# Patient Record
Sex: Male | Born: 1965 | Race: White | Hispanic: No | Smoking: Former smoker
Health system: Southern US, Community
[De-identification: ages and names within clinical notes are randomized; demographics above are authoritative.]

## PROBLEM LIST (undated history)

## (undated) DIAGNOSIS — K5790 Diverticulosis of intestine, part unspecified, without perforation or abscess without bleeding: Secondary | ICD-10-CM

## (undated) DIAGNOSIS — I10 Essential (primary) hypertension: Secondary | ICD-10-CM

## (undated) DIAGNOSIS — K589 Irritable bowel syndrome without diarrhea: Secondary | ICD-10-CM

## (undated) DIAGNOSIS — G4730 Sleep apnea, unspecified: Secondary | ICD-10-CM

## (undated) DIAGNOSIS — K219 Gastro-esophageal reflux disease without esophagitis: Secondary | ICD-10-CM

## (undated) HISTORY — PX: FOOT SURGERY: SHX648

## (undated) HISTORY — DX: Essential (primary) hypertension: I10

## (undated) HISTORY — DX: Sleep apnea, unspecified: G47.30

## (undated) HISTORY — DX: Irritable bowel syndrome, unspecified: K58.9

## (undated) HISTORY — DX: Diverticulosis of intestine, part unspecified, without perforation or abscess without bleeding: K57.90

## (undated) HISTORY — DX: Gastro-esophageal reflux disease without esophagitis: K21.9

## (undated) HISTORY — PX: COLONOSCOPY: SHX174

---

## 2003-06-17 ENCOUNTER — Other Ambulatory Visit: Payer: Self-pay

## 2004-05-05 ENCOUNTER — Emergency Department: Payer: Self-pay | Admitting: Emergency Medicine

## 2004-10-21 ENCOUNTER — Ambulatory Visit: Payer: Self-pay

## 2004-12-06 ENCOUNTER — Emergency Department: Payer: Self-pay | Admitting: Emergency Medicine

## 2006-11-01 ENCOUNTER — Ambulatory Visit: Payer: Self-pay

## 2006-11-15 ENCOUNTER — Ambulatory Visit: Payer: Self-pay

## 2007-04-16 ENCOUNTER — Ambulatory Visit: Payer: Self-pay

## 2010-08-14 ENCOUNTER — Ambulatory Visit: Payer: Self-pay | Admitting: Internal Medicine

## 2011-05-23 ENCOUNTER — Ambulatory Visit: Payer: Self-pay | Admitting: General Practice

## 2013-04-22 ENCOUNTER — Emergency Department: Payer: Self-pay | Admitting: Emergency Medicine

## 2015-06-04 ENCOUNTER — Ambulatory Visit (INDEPENDENT_AMBULATORY_CARE_PROVIDER_SITE_OTHER): Payer: PRIVATE HEALTH INSURANCE | Admitting: Podiatry

## 2015-06-04 ENCOUNTER — Ambulatory Visit (INDEPENDENT_AMBULATORY_CARE_PROVIDER_SITE_OTHER): Payer: PRIVATE HEALTH INSURANCE

## 2015-06-04 ENCOUNTER — Telehealth: Payer: Self-pay | Admitting: *Deleted

## 2015-06-04 ENCOUNTER — Encounter: Payer: Self-pay | Admitting: Podiatry

## 2015-06-04 VITALS — BP 136/88 | HR 69 | Resp 16

## 2015-06-04 DIAGNOSIS — M779 Enthesopathy, unspecified: Secondary | ICD-10-CM

## 2015-06-04 DIAGNOSIS — S92352A Displaced fracture of fifth metatarsal bone, left foot, initial encounter for closed fracture: Secondary | ICD-10-CM

## 2015-06-04 DIAGNOSIS — M79672 Pain in left foot: Secondary | ICD-10-CM

## 2015-06-04 MED ORDER — METHYLPREDNISOLONE 4 MG PO TBPK: ORAL_TABLET | ORAL | Status: DC

## 2015-06-04 MED ORDER — MELOXICAM 15 MG PO TABS: 15.0000 mg | ORAL_TABLET | Freq: Every day | ORAL | Status: DC

## 2015-06-04 NOTE — Progress Notes (Signed)
   Subjective:    Patient ID: Alexander Ramsey, male    DOB: November 01, 1965, 49 y.o.   MRN: [id number]  HPI: He presents today with a several month duration of pain to his fifth metatarsal base and lateral ankle left. States that many years ago as a teenager he injured his foot while playing football and this area would occasionally become painful however it would generally subside he states that as of late it has become very painful to the point where he can hardly walk.  Review of Systems  HENT: Positive for sinus pressure.   Musculoskeletal: Positive for back pain, arthralgias and gait problem.  Neurological: Positive for numbness.  All other systems reviewed and are negative.      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 Pulses are strongly palpable. Neurologic sensorium is intact per Semmes-Weinstein monofilament. Deep tendon reflexes are intact. Muscle strength +5 over 5 dorsiflexion plantar flexors and inverters and everters all into the musculature is intact. Orthopedic evaluation demonstrates all joints distal to the ankle while full range of motion without crepitation. His pain on eversion against resistance as we palpate the fifth metatarsal base and the peroneal brevis tendon left. Radiographic evaluation 3 views left foot taken in the office today has demonstrated old fracture to the fifth metatarsal base with an avulsion fracture and thickening of the peroneal tendon. Cutaneous evaluation demonstrates no open lesions or wounds.        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  Plan: At this point this is a surgical fix. I did recommend a ankle stabilizer and an MRI. I also recommended prednisone as well as meloxicam. I will follow up with him for surgical consult after the MRI.

## 2015-06-04 NOTE — Telephone Encounter (Addendum)
Pt states Dr. Milinda Pointer had discuss possible surgery, but had wanted a MRI 1st, and pt wanted to schedule for 06/2015.  I told pt once I had received the orders from Dr. Milinda Pointer, I would get the procedure pre-certed but I would not be the one to schedule, it would be [facility] to call and schedule.  Orders faxed to [facility].  [place], NO PRIOR AUTHORIZATION NEEDED IF PROCEDURE IS IN-NETWORK, [facility] OF:[id number], REFERENCE:  Robina Ade., 06/05/2015 2:29PM.  FAXED TO [place].  Informed pt's wife, Freida Busman of the insurance prior authorization outcome and gave her central appt line [facility].

## 2015-06-04 NOTE — Addendum Note (Signed)
Addended by: Clovis Riley E on: 06/04/2015 05:50 PM   Modules accepted: Orders

## 2015-06-05 ENCOUNTER — Telehealth: Payer: Self-pay | Admitting: *Deleted

## 2015-06-05 DIAGNOSIS — M779 Enthesopathy, unspecified: Secondary | ICD-10-CM

## 2015-06-05 DIAGNOSIS — S92352A Displaced fracture of fifth metatarsal bone, left foot, initial encounter for closed fracture: Secondary | ICD-10-CM

## 2015-06-05 NOTE — Telephone Encounter (Signed)
"  I'm calling for my husband, Alexander Ramsey.  He was seen today by Dr. Milinda Pointer.  It was determined that he needed surgery.  I'm calling, I'm not sure what is left.  I talked with Jocelyn Lamer and we do want to get it done before the end of this year.  If you could go ahead and get it scheduled and call me with the information.  Also Marcy Siren is trying to schedule an MRI for him.  Can you find out where we are with this?  I'd appreciate it.  Give me a call back."

## 2015-06-05 NOTE — Telephone Encounter (Signed)
I informed pt's wife, that as I had informed pt yesterday once the MRI was approved by his insurance the [facility] would call to schedule.  I told her that I had a period of time that I would worked prior authorizations and I would begin his today.

## 2015-06-05 NOTE — Telephone Encounter (Addendum)
Pt's wife, Freida Busman states the earliest MRI could be scheduled at [facility] was 06/24/2015, and Dr. Stephenie Acres last DOS 06/27/2015, and pt's insurance deductible is going up at the 1st of the year.  I called [facility] and the 1st available was 06/11/2015, called [facility] [phone], NOT PRIOR AUTHORIZATION FOR FACILITY Deeann Saint TAX ID# [id number], REFERENCE:  Gwynneth Aliment. 06/05/2015 3:39pm.  Orders and insurance outcome faxed to [facility] informed to call and schedule.  06/10/2015 [facility] request changing MRI foot to MRI ankle.  I asked Dr. Milinda Pointer and he states that as long as the 5th metatarsal base tendon area and non-union are visible that is okay.  06/16/2015 - Informed pt's wife of need for an appt, she states the appt is set for 06/20/2015.

## 2015-06-06 ENCOUNTER — Telehealth: Payer: Self-pay | Admitting: *Deleted

## 2015-06-06 NOTE — Telephone Encounter (Signed)
"  I'm returning your call from yesterday.  "Yes, I know you don't know the procedure that Alexander Ramsey is going to need.  We are trying to get our ducks in a row.  We scheduled his MRI for the 7th and he's scheduled to see Dr. Milinda Pointer on the 14th for the results.  Dr. Milinda Pointer had mentioned that his only other available date for surgery is the 23rd.  Is there any way possible we can get put him down for that day?"  I can put him down tentatively.  "Oh that would be great!  The reason we need to do it now is because next year our deductible will be $1500."  I understand, I'll put him down tentatively.

## 2015-06-16 ENCOUNTER — Other Ambulatory Visit: Payer: Self-pay | Admitting: Family Medicine

## 2015-06-18 ENCOUNTER — Ambulatory Visit: Payer: PRIVATE HEALTH INSURANCE | Admitting: Podiatry

## 2015-06-18 ENCOUNTER — Telehealth: Payer: Self-pay | Admitting: *Deleted

## 2015-06-18 NOTE — Telephone Encounter (Addendum)
Pt's wife, Freida Busman states pt works for the [employer] and got caught up in something at work and was going to be 15 minutes late and the office rescheduled him to 06/25/2015, would that be too late for a 06/27/2015 surgery.  I told her no at times we have had to book emergency the day before.  I told her I would inform the surgery coordinator so she would be prepared. 06/25/2015 - Requested MRI results to be faxed to [facility].

## 2015-06-24 ENCOUNTER — Ambulatory Visit: Payer: Self-pay

## 2015-06-25 ENCOUNTER — Encounter: Payer: Self-pay | Admitting: Podiatry

## 2015-06-25 ENCOUNTER — Ambulatory Visit (INDEPENDENT_AMBULATORY_CARE_PROVIDER_SITE_OTHER): Payer: PRIVATE HEALTH INSURANCE | Admitting: Podiatry

## 2015-06-25 VITALS — BP 149/89 | HR 79 | Resp 18

## 2015-06-25 DIAGNOSIS — S86312D Strain of muscle(s) and tendon(s) of peroneal muscle group at lower leg level, left leg, subsequent encounter: Secondary | ICD-10-CM | POA: Diagnosis not present

## 2015-06-25 DIAGNOSIS — S92352A Displaced fracture of fifth metatarsal bone, left foot, initial encounter for closed fracture: Secondary | ICD-10-CM

## 2015-06-25 DIAGNOSIS — M779 Enthesopathy, unspecified: Secondary | ICD-10-CM | POA: Diagnosis not present

## 2015-06-25 NOTE — Progress Notes (Signed)
He presents today for MRI results of his left foot and ankle. He states that the anti-inflammatories that we provided for him the prednisone and secondly the meloxicam seem to work wonders for him. He states that he is doing much better with this at this time. He knows that these will have Surgery regarding his left foot and like to go ahead and schedule it if possible.  Objective: Vital signs are stable he is alert and oriented 3 pulses are strongly palpable to the left lower extremity. Moderate edema overlying the peroneal tendons of the left foot with pain on palpation of the peroneal tendons as well as the fifth metatarsal base of the left foot. I reviewed the MRI with him today which did indicate a tear of the peroneal brevis tendon as well as a fifth metatarsal base fracture.  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  Plan: We went over a consent form today line by line number by number giving him ample time to ask questions he saw fit regarding a primary repair of the peroneal brevis tendon of the left foot. I answered all questions regarding this procedure to the best of my ability in layman's terms. He understands this and is amenable to it. He understands that there are possible postop complications which may include but are not limited to postop pain bleeding swelling infection recurrence and need for further surgery loss of digit loss of limb loss of life. We also discussed ambulation and that he will not be ambulating on this for a period of 6-8 weeks and will need the 2 utilized a knee scooter or crutches. I will follow-up with him Friday x-rays were especially surgical center.

## 2015-06-25 NOTE — Patient Instructions (Signed)
Pre-Operative Instructions  Congratulations, you have decided to take an important step to improving your quality of life.  You can be assured that the doctors of [facility] will be with you every step of the way.  1. Plan to be at the surgery center/hospital at least 1 (one) hour prior to your scheduled time unless otherwise directed by the surgical center/hospital staff.  You must have a responsible adult accompany you, remain during the surgery and drive you home.  Make sure you have directions to the surgical center/hospital and know how to get there on time. 2. For hospital based surgery you will need to obtain a history and physical form from your family physician within 1 month prior to the date of surgery- we will give you a form for you primary physician.  3. We make every effort to accommodate the date you request for surgery.  There are however, times where surgery dates or times have to be moved.  We will contact you as soon as possible if a change in schedule is required.   4. No Aspirin/Ibuprofen for one week before surgery.  If you are on aspirin, any non-steroidal anti-inflammatory medications (Mobic, Aleve, Ibuprofen) you should stop taking it 7 days prior to your surgery.  You make take Tylenol  For pain prior to surgery.  5. Medications- If you are taking daily heart and blood pressure medications, seizure, reflux, allergy, asthma, anxiety, pain or diabetes medications, make sure the surgery center/hospital is aware before the day of surgery so they may notify you which medications to take or avoid the day of surgery. 6. No food or drink after midnight the night before surgery unless directed otherwise by surgical center/hospital staff. 7. No alcoholic beverages 24 hours prior to surgery.  No smoking 24 hours prior to or 24 hours after surgery. 8. Wear loose pants or shorts- loose enough to fit over bandages, boots, and casts. 9. No slip on shoes, sneakers are best. 10. Bring  your boot with you to the surgery center/hospital.  Also bring crutches or a walker if your physician has prescribed it for you.  If you do not have this equipment, it will be provided for you after surgery. 11. If you have not been contracted by the surgery center/hospital by the day before your surgery, call to confirm the date and time of your surgery. 12. Leave-time from work may vary depending on the type of surgery you have.  Appropriate arrangements should be made prior to surgery with your employer. 13. Prescriptions will be provided immediately following surgery by your doctor.  Have these filled as soon as possible after surgery and take the medication as directed. 14. Remove nail polish on the operative foot. 15. Wash the night before surgery.  The night before surgery wash the foot and leg well with the antibacterial soap provided and water paying special attention to beneath the toenails and in between the toes.  Rinse thoroughly with water and dry well with a towel.  Perform this wash unless told not to do so by your physician.  Enclosed: 1 Ice pack (please put in freezer the night before surgery)   1 Hibiclens skin cleaner   Pre-op Instructions  If you have any questions regarding the instructions, do not hesitate to call our office.  [place]: [address] [place], [place] [address] [phone]  [place]: [address] [place], [place] [address] [phone]  [place]: [address]  , [place] [address] [phone]  Dr. Richard   Tuchman DPM, Dr. Norman Regal DPM Dr. Richard Sikora DPM, Dr. M. Todd Hyatt DPM, Dr. Kathryn Egerton DPM 

## 2015-06-26 ENCOUNTER — Other Ambulatory Visit: Payer: Self-pay | Admitting: Podiatry

## 2015-06-26 ENCOUNTER — Telehealth: Payer: Self-pay | Admitting: *Deleted

## 2015-06-26 MED ORDER — HYDROMORPHONE HCL 4 MG PO TABS: ORAL_TABLET | ORAL | Status: DC

## 2015-06-26 MED ORDER — CEPHALEXIN 500 MG PO CAPS: 500.0000 mg | ORAL_CAPSULE | Freq: Three times a day (TID) | ORAL | Status: DC

## 2015-06-26 MED ORDER — PROMETHAZINE HCL 25 MG PO TABS: 25.0000 mg | ORAL_TABLET | Freq: Three times a day (TID) | ORAL | Status: DC | PRN

## 2015-06-26 NOTE — Telephone Encounter (Signed)
"  Please give me a call.  I need to get the procedure code.  That page of the fax was cut off."    I attempted to call.  I could not leave a message phone continuously rung.    I faxed requested information to her.

## 2015-06-26 NOTE — Telephone Encounter (Signed)
I'm calling to inform you there is an issue with your insurance.  There's a clause that states that your surgeries have to be done in [facility].  I am trying to get it authorized.  They requested your clinical notes.  I faxed them the information.  I attempted to call and check on the status but they were closed.  The surgical center is going to call in the morning to check on the status.  "Okay, just have them to call me and let me know.  I should be home all day."

## 2015-06-26 NOTE — Telephone Encounter (Signed)
I called and left a message for Caren Griffins at [facility] to call Aniceto Boss at [place] directly to check on the status of authorization.  Her direct number is [phone].

## 2015-06-27 DIAGNOSIS — S92352A Displaced fracture of fifth metatarsal bone, left foot, initial encounter for closed fracture: Secondary | ICD-10-CM | POA: Diagnosis not present

## 2015-07-02 ENCOUNTER — Ambulatory Visit (INDEPENDENT_AMBULATORY_CARE_PROVIDER_SITE_OTHER): Payer: PRIVATE HEALTH INSURANCE | Admitting: Podiatry

## 2015-07-02 ENCOUNTER — Encounter: Payer: Self-pay | Admitting: Podiatry

## 2015-07-02 DIAGNOSIS — S86312D Strain of muscle(s) and tendon(s) of peroneal muscle group at lower leg level, left leg, subsequent encounter: Secondary | ICD-10-CM

## 2015-07-02 NOTE — Progress Notes (Signed)
He presents today utilizing a mini scooter status post peroneal tendon repair and removal of a fracture fragment fifth metatarsal base left foot. He states that I have really had no pain with this whatsoever. He denies fever chills nausea vomiting muscle aches and pains.  Objective: Vital signs are stable he is alert and oriented 3. He presents today with a knee scooter and his cast left lower extremity. The bottom of the the cast is dirty and he relates to being on the cast more than he should've been. He states that his toes have good sensation and they're freely movable. He has no calf pain or shortness of breath.  Assessment: No pain status post peroneal tendon repair left.  Plan: Stay in his cast for another week continue nonweightbearing status. We will remove the cast next week and remove some sutures as possible. We will then reapply another cast.

## 2015-07-09 ENCOUNTER — Ambulatory Visit (INDEPENDENT_AMBULATORY_CARE_PROVIDER_SITE_OTHER): Payer: PRIVATE HEALTH INSURANCE | Admitting: Podiatry

## 2015-07-09 ENCOUNTER — Encounter: Payer: Self-pay | Admitting: Podiatry

## 2015-07-09 DIAGNOSIS — Z9889 Other specified postprocedural states: Secondary | ICD-10-CM | POA: Diagnosis not present

## 2015-07-09 DIAGNOSIS — S86312D Strain of muscle(s) and tendon(s) of peroneal muscle group at lower leg level, left leg, subsequent encounter: Secondary | ICD-10-CM | POA: Diagnosis not present

## 2015-07-09 MED ORDER — DOXYCYCLINE HYCLATE 100 MG PO TABS: 100.0000 mg | ORAL_TABLET | Freq: Two times a day (BID) | ORAL | Status: DC

## 2015-07-09 NOTE — Progress Notes (Signed)
He presents today 2 weeks status post peroneal tendon repair left foot. He denies fever chills nausea vomiting muscle aches and pains. He states that he walks on the cast occasionally.  Objective: Once his old cast was removed there demonstrates some erythema along the lateral incision site but no cellulitis drainage or odor. This appears to be maceration. No tenderness on palpation.  Assessment: Well-healing surgical foot left.  Plan: Follow up with him in 2 weeks for cast removal as we replaced his cast today. I also dispensed doxycycline just to help cover any slight skin infection. He is to keep this elevated and dry we will allow him to return to light duty working 4-6 hours a day next week.

## 2015-07-14 ENCOUNTER — Encounter: Payer: Self-pay | Admitting: Podiatry

## 2015-07-16 ENCOUNTER — Encounter: Payer: Self-pay | Admitting: Podiatry

## 2015-07-21 ENCOUNTER — Other Ambulatory Visit: Payer: Self-pay | Admitting: Family Medicine

## 2015-07-23 ENCOUNTER — Ambulatory Visit (INDEPENDENT_AMBULATORY_CARE_PROVIDER_SITE_OTHER): Payer: Managed Care, Other (non HMO) | Admitting: Podiatry

## 2015-07-23 ENCOUNTER — Encounter: Payer: Self-pay | Admitting: Podiatry

## 2015-07-23 DIAGNOSIS — S86312D Strain of muscle(s) and tendon(s) of peroneal muscle group at lower leg level, left leg, subsequent encounter: Secondary | ICD-10-CM

## 2015-07-23 DIAGNOSIS — Z9889 Other specified postprocedural states: Secondary | ICD-10-CM | POA: Diagnosis not present

## 2015-07-23 NOTE — Progress Notes (Signed)
He presents today just short of 4 weeks status post peroneal tendon repair left foot. He presents today in his cast and his knee scooter. He does state that he had a very good weekend at the beach and had an accident with his knee scooter. He states he really hasn't had a lot of pain with this and feels much better.  Objective: Vital signs are stable he is alert and oriented 3 presents with his cast on today was removed demonstrates a considerable amount of dead skin staples are intact once removed demonstrates margins well coapted no erythema minimal edema no cellulitis drainage or odor. He has full range of motion without pain plantar flexion abduction against resistance dorsiflexion and inversion.  Assessment: Well-healing surgical foot left.  Plan: We removed his cast today removed all of his sutures and staples and placed him in a compression anklet and a Cam Walker. I encouraged him to continue the use of the scooter for the next 2 weeks in a nonweightbearing fashion possibly putting his foot down in a static motion after next week. I will allow him to start washing this and getting this foot cleaned up and I will follow-up with him in 2 weeks.

## 2015-08-05 ENCOUNTER — Other Ambulatory Visit: Payer: Self-pay | Admitting: Family Medicine

## 2015-08-11 ENCOUNTER — Encounter: Payer: Self-pay | Admitting: Podiatry

## 2015-08-11 ENCOUNTER — Ambulatory Visit (INDEPENDENT_AMBULATORY_CARE_PROVIDER_SITE_OTHER): Payer: Managed Care, Other (non HMO) | Admitting: Podiatry

## 2015-08-11 VITALS — BP 120/86 | HR 69 | Resp 12

## 2015-08-11 DIAGNOSIS — S86312D Strain of muscle(s) and tendon(s) of peroneal muscle group at lower leg level, left leg, subsequent encounter: Secondary | ICD-10-CM

## 2015-08-11 DIAGNOSIS — Z9889 Other specified postprocedural states: Secondary | ICD-10-CM

## 2015-08-11 NOTE — Progress Notes (Signed)
He presents today for follow-up of peroneal brevis surgery left foot. Date of surgery 06/27/2015. He denies fever chills nausea vomiting muscle aches and pains. States that his foot does not hurt him at all.  Objective: Vital signs are stable he is alert and oriented 3. Pulses are strongly palpable. He has no pain with abduction against resistance and no pain on palpation of the peroneal brevis tendon or incision site. No edema no cellulitis drainage or odor.  Assessment: Well-healing surgical foot left.  Plan: I encouraged him to wear supportive shoe gear and wean off of his Cam Walker over the next month I will follow up with him at that time. He will notify me with any questions or concerns.

## 2015-09-08 ENCOUNTER — Ambulatory Visit (INDEPENDENT_AMBULATORY_CARE_PROVIDER_SITE_OTHER): Payer: Managed Care, Other (non HMO) | Admitting: Podiatry

## 2015-09-08 ENCOUNTER — Encounter: Payer: Self-pay | Admitting: Podiatry

## 2015-09-08 VITALS — BP 127/79 | HR 87 | Resp 12

## 2015-09-08 DIAGNOSIS — Z9889 Other specified postprocedural states: Secondary | ICD-10-CM

## 2015-09-08 NOTE — Progress Notes (Signed)
He presents today for follow-up of his peroneal brevis tendon repair left. He states that he still gets sore on occasion particularly when I'm on the bike but all in all it is doing so much better than it was prior to surgery. Date of surgery 06/27/2015.  Objective: Vital signs stable alert and oriented 3. Pulses are palpable. Neurologic sensorium is intact per Semmes-Weinstein monofilament. Deep tendon reflexes are intact muscle strength +5 over 5 dorsiflexion plantar flexors and inverters everters all intrinsic musculature is intact. No pain on palpation or abduction against resistance of the left foot along the peroneal brevis tendon.  Assessment well-healing surgical foot left.  Plan: After 6 months he may get back to his regular routine until then he is to continue his current workout regimen and I will follow-up with him on an as-needed basis.

## 2015-09-15 ENCOUNTER — Ambulatory Visit (INDEPENDENT_AMBULATORY_CARE_PROVIDER_SITE_OTHER): Payer: Managed Care, Other (non HMO) | Admitting: Podiatry

## 2015-09-15 ENCOUNTER — Encounter: Payer: Self-pay | Admitting: Podiatry

## 2015-09-15 VITALS — BP 164/101 | HR 97 | Resp 16

## 2015-09-15 DIAGNOSIS — Z9889 Other specified postprocedural states: Secondary | ICD-10-CM

## 2015-09-15 DIAGNOSIS — M722 Plantar fascial fibromatosis: Secondary | ICD-10-CM | POA: Diagnosis not present

## 2015-09-15 DIAGNOSIS — S86312D Strain of muscle(s) and tendon(s) of peroneal muscle group at lower leg level, left leg, subsequent encounter: Secondary | ICD-10-CM

## 2015-09-15 NOTE — Progress Notes (Signed)
He presents today 1 week after being released from peroneal tendon repair. He states that he may have overdone it on the treadmill. He states that his heel started hurting Friday and was really bad on Saturday.  Objective: Vital signs are stable he is alert and oriented 3. Pulses are palpable. He has pain on palpation medial calcaneal tubercle and central plantar calcaneal tubercle of the left foot. No pain on palpation of the surgical site.  Assessment: Plantar fasciitis left foot.  Plan: Injected his left heel and place him in ankle brace. We will follow-up with him as needed.

## 2015-09-16 ENCOUNTER — Other Ambulatory Visit: Payer: Self-pay | Admitting: Family Medicine

## 2015-09-16 ENCOUNTER — Encounter: Payer: Self-pay | Admitting: Family Medicine

## 2015-09-16 NOTE — Telephone Encounter (Signed)
Letter sent.

## 2015-09-16 NOTE — Telephone Encounter (Signed)
apt 

## 2015-09-17 ENCOUNTER — Other Ambulatory Visit: Payer: Self-pay | Admitting: Family Medicine

## 2015-10-06 ENCOUNTER — Ambulatory Visit: Payer: Managed Care, Other (non HMO) | Admitting: Podiatry

## 2015-11-10 ENCOUNTER — Encounter: Payer: Self-pay | Admitting: Family Medicine

## 2015-11-10 ENCOUNTER — Other Ambulatory Visit: Payer: Self-pay | Admitting: Family Medicine

## 2015-11-10 NOTE — Telephone Encounter (Signed)
Apt with anyone

## 2015-11-10 NOTE — Telephone Encounter (Signed)
2nd letter sent

## 2015-11-25 ENCOUNTER — Encounter: Payer: Self-pay | Admitting: Family Medicine

## 2015-11-25 ENCOUNTER — Ambulatory Visit (INDEPENDENT_AMBULATORY_CARE_PROVIDER_SITE_OTHER): Payer: Managed Care, Other (non HMO) | Admitting: Family Medicine

## 2015-11-25 VITALS — BP 133/88 | HR 85 | Temp 98.7°F | Wt 300.0 lb

## 2015-11-25 DIAGNOSIS — I10 Essential (primary) hypertension: Secondary | ICD-10-CM | POA: Diagnosis not present

## 2015-11-25 DIAGNOSIS — F329 Major depressive disorder, single episode, unspecified: Secondary | ICD-10-CM | POA: Diagnosis not present

## 2015-11-25 DIAGNOSIS — F32A Depression, unspecified: Secondary | ICD-10-CM

## 2015-11-25 MED ORDER — ESOMEPRAZOLE MAGNESIUM 40 MG PO CPDR: 40.0000 mg | DELAYED_RELEASE_CAPSULE | Freq: Every day | ORAL | Status: DC

## 2015-11-25 MED ORDER — LOSARTAN POTASSIUM-HCTZ 50-12.5 MG PO TABS: 1.0000 | ORAL_TABLET | Freq: Every day | ORAL | Status: DC

## 2015-11-25 MED ORDER — METOPROLOL SUCCINATE ER 50 MG PO TB24: 50.0000 mg | ORAL_TABLET | Freq: Every day | ORAL | Status: DC

## 2015-11-25 MED ORDER — CITALOPRAM HYDROBROMIDE 40 MG PO TABS: 40.0000 mg | ORAL_TABLET | Freq: Every day | ORAL | Status: DC

## 2015-11-25 NOTE — Progress Notes (Signed)
   BP 133/88 mmHg  Pulse 85  Temp(Src) 98.7 F (37.1 C)  Wt 300 lb (136.079 kg)  SpO2 99%   Subjective:    Patient ID: Alexander Ramsey, male    DOB: 1965/12/20, 51 y.o.   MRN: [id number]  HPI: Alexander Ramsey is a 50 y.o. male  Chief Complaint  Patient presents with  . Depression  . Hypertension  . pain in genital area   Patient with great deal of stress with best friend dying from colon cancer surgery. Depressions okay taking citalopram without problems and will continue. Blood pressure all in all is been okay but elevated these last 2 weeks after death of his friend as above. Taking medications without side effects. Blood pressures been controlled with diet exercise nutrition which patient is starting to go back his already lost 10 pounds. Has some nonspecific tingling-type burning pain in testicle area between testes last a few seconds has abated today no blood in stool or urine or other bowel-type issues of sexual dysfunction  Relevant past medical, surgical, family and social history reviewed and updated as indicated. Interim medical history since our last visit reviewed. Allergies and medications reviewed and updated.  Review of Systems  Constitutional: Negative.   Respiratory: Negative.   Cardiovascular: Negative.     Per HPI unless specifically indicated above     Objective:    BP 133/88 mmHg  Pulse 85  Temp(Src) 98.7 F (37.1 C)  Wt 300 lb (136.079 kg)  SpO2 99%  Wt Readings from Last 3 Encounters:  11/25/15 300 lb (136.079 kg)  10/09/14 290 lb (131.543 kg)    Physical Exam  Constitutional: He is oriented to person, place, and time. He appears well-developed and well-nourished. No distress.  HENT:  Head: Normocephalic and atraumatic.  Right Ear: Hearing normal.  Left Ear: Hearing normal.  Nose: Nose normal.  Eyes: Conjunctivae and lids are normal. Right eye exhibits no discharge. Left eye exhibits no discharge. No scleral icterus.  Cardiovascular:  Normal rate, regular rhythm and normal heart sounds.   Pulmonary/Chest: Effort normal and breath sounds normal. No respiratory distress.  Abdominal: Soft. Bowel sounds are normal.  Genitourinary: Penis normal.  Testicles normal without hernia  Musculoskeletal: Normal range of motion.  Neurological: He is alert and oriented to person, place, and time.  Skin: Skin is intact. No rash noted.  Psychiatric: He has a normal mood and affect. His speech is normal and behavior is normal. Judgment and thought content normal. Cognition and memory are normal.    No results found for this or any previous visit.    Assessment & Plan:   Problem List Items Addressed This Visit      Cardiovascular and Mediastinum   Essential hypertension - Primary    The current medical regimen is effective;  continue present plan and medications.       Relevant Medications   losartan-hydrochlorothiazide (HYZAAR) 50-12.5 MG tablet   metoprolol succinate (TOPROL-XL) 50 MG 24 hr tablet     Other   Depression    The current medical regimen is effective;  continue present plan and medications.       Relevant Medications   citalopram (CELEXA) 40 MG tablet     nonspecific scrotal pain will observe  Follow up plan: Return in about 3 months (around 02/25/2016) for Physical Exam.

## 2015-11-25 NOTE — Assessment & Plan Note (Signed)
The current medical regimen is effective;  continue present plan and medications.  

## 2015-12-03 ENCOUNTER — Other Ambulatory Visit: Payer: Self-pay

## 2015-12-03 DIAGNOSIS — Z299 Encounter for prophylactic measures, unspecified: Secondary | ICD-10-CM

## 2015-12-03 NOTE — Progress Notes (Signed)
Patient came in to have blood drawn per Dr. Golden Pop at [facility].  Blood was drawn from the right arm without any incident.

## 2015-12-04 LAB — CMP12+LP+TP+TSH+6AC+PSA+CBC…
A/G RATIO: 1.9 (ref 1.2–2.2)
ALBUMIN: 4.1 g/dL (ref 3.5–5.5)
ALK PHOS: 70 IU/L (ref 39–117)
ALT: 33 IU/L (ref 0–44)
AST: 34 IU/L (ref 0–40)
BASOS: 0 %
BILIRUBIN TOTAL: 0.5 mg/dL (ref 0.0–1.2)
BUN / CREAT RATIO: 11 (ref 9–20)
BUN: 12 mg/dL (ref 6–24)
Basophils Absolute: 0 10*3/uL (ref 0.0–0.2)
CHLORIDE: 95 mmol/L — AB (ref 96–106)
CHOLESTEROL TOTAL: 149 mg/dL (ref 100–199)
Calcium: 9 mg/dL (ref 8.7–10.2)
Chol/HDL Ratio: 3.5 ratio units (ref 0.0–5.0)
Creatinine, Ser: 1.13 mg/dL (ref 0.76–1.27)
EOS (ABSOLUTE): 0.1 10*3/uL (ref 0.0–0.4)
Eos: 2 %
Estimated CHD Risk: 0.6 times avg. (ref 0.0–1.0)
FREE THYROXINE INDEX: 1.4 (ref 1.2–4.9)
GFR calc non Af Amer: 76 mL/min/{1.73_m2} (ref >59)
GFR, EST AFRICAN AMERICAN: 88 mL/min/{1.73_m2} (ref >59)
GGT: 28 IU/L (ref 0–65)
GLUCOSE: 93 mg/dL (ref 65–99)
Globulin, Total: 2.2 g/dL (ref 1.5–4.5)
HDL: 42 mg/dL (ref >39)
HEMOGLOBIN: 13.8 g/dL (ref 12.6–17.7)
Hematocrit: 42.1 % (ref 37.5–51.0)
IMMATURE GRANULOCYTES: 0 %
IRON: 97 ug/dL (ref 38–169)
Immature Grans (Abs): 0 10*3/uL (ref 0.0–0.1)
LDH: 219 IU/L (ref 121–224)
LDL CALC: 56 mg/dL (ref 0–99)
LYMPHS ABS: 1.3 10*3/uL (ref 0.7–3.1)
LYMPHS: 28 %
MCH: 31.9 pg (ref 26.6–33.0)
MCHC: 32.8 g/dL (ref 31.5–35.7)
MCV: 97 fL (ref 79–97)
MONOCYTES: 6 %
Monocytes Absolute: 0.3 10*3/uL (ref 0.1–0.9)
NEUTROS ABS: 3 10*3/uL (ref 1.4–7.0)
NEUTROS PCT: 64 %
PLATELETS: 173 10*3/uL (ref 150–379)
PROSTATE SPECIFIC AG, SERUM: 1.1 ng/mL (ref 0.0–4.0)
Phosphorus: 3.4 mg/dL (ref 2.5–4.5)
Potassium: 4 mmol/L (ref 3.5–5.2)
RBC: 4.33 x10E6/uL (ref 4.14–5.80)
RDW: 13.6 % (ref 12.3–15.4)
SODIUM: 138 mmol/L (ref 134–144)
T3 UPTAKE RATIO: 31 % (ref 24–39)
T4, Total: 4.6 ug/dL (ref 4.5–12.0)
TOTAL PROTEIN: 6.3 g/dL (ref 6.0–8.5)
TRIGLYCERIDES: 255 mg/dL — AB (ref 0–149)
TSH: 2.96 u[IU]/mL (ref 0.450–4.500)
Uric Acid: 8.8 mg/dL — ABNORMAL HIGH (ref 3.7–8.6)
VLDL CHOLESTEROL CAL: 51 mg/dL — AB (ref 5–40)
WBC: 4.7 10*3/uL (ref 3.4–10.8)

## 2015-12-05 ENCOUNTER — Encounter: Payer: Self-pay | Admitting: Family Medicine

## 2015-12-05 ENCOUNTER — Ambulatory Visit (INDEPENDENT_AMBULATORY_CARE_PROVIDER_SITE_OTHER): Payer: Managed Care, Other (non HMO) | Admitting: Family Medicine

## 2015-12-05 VITALS — BP 136/84 | HR 78 | Temp 98.0°F | Ht 73.5 in | Wt 302.0 lb

## 2015-12-05 DIAGNOSIS — N451 Epididymitis: Secondary | ICD-10-CM | POA: Diagnosis not present

## 2015-12-05 DIAGNOSIS — I10 Essential (primary) hypertension: Secondary | ICD-10-CM

## 2015-12-05 LAB — URINALYSIS, ROUTINE W REFLEX MICROSCOPIC
Bilirubin, UA: NEGATIVE
Glucose, UA: NEGATIVE
Ketones, UA: NEGATIVE
LEUKOCYTES UA: NEGATIVE
NITRITE UA: NEGATIVE
PH UA: 6.5 (ref 5.0–7.5)
Protein, UA: NEGATIVE
RBC UA: NEGATIVE
Specific Gravity, UA: 1.015 (ref 1.005–1.030)
UUROB: 0.2 mg/dL (ref 0.2–1.0)

## 2015-12-05 MED ORDER — SULFAMETHOXAZOLE-TRIMETHOPRIM 800-160 MG PO TABS: 1.0000 | ORAL_TABLET | Freq: Two times a day (BID) | ORAL | Status: DC

## 2015-12-05 NOTE — Assessment & Plan Note (Signed)
The current medical regimen is effective;  continue present plan and medications.  

## 2015-12-05 NOTE — Progress Notes (Signed)
BP 136/84 mmHg  Pulse 78  Temp(Src) 98 F (36.7 C)  Ht 6' 1.5" (1.867 m)  Wt 302 lb (136.986 kg)  BMI 39.30 kg/m2  SpO2 95%   Subjective:    Patient ID: Alexander Ramsey, male    DOB: [birth year], 50 y.o.   MRN: [id number]  HPI: Alexander Ramsey is a 50 y.o. male  Chief Complaint  Patient presents with  . Testicle Pain  Pain mentioned last time is gotten more intense and daily feels often like testicles being tugged on enough to be uncomfortable. No change in his ejaculate symptoms urinary symptoms bowel symptoms no blood in stool or urine. No known trauma irritation  Blood pressure initially elevated though on repeat blood pressure is good complaints from medications.  Blood review uric acid elevated patient's been eating a lot of peanut butter Triglycerides also elevated  Relevant past medical, surgical, family and social history reviewed and updated as indicated. Interim medical history since our last visit reviewed. Allergies and medications reviewed and updated.  Review of Systems  Constitutional: Negative.   Respiratory: Negative.   Cardiovascular: Negative.     Per HPI unless specifically indicated above     Objective:    BP 136/84 mmHg  Pulse 78  Temp(Src) 98 F (36.7 C)  Ht 6' 1.5" (1.867 m)  Wt 302 lb (136.986 kg)  BMI 39.30 kg/m2  SpO2 95%  Wt Readings from Last 3 Encounters:  12/05/15 302 lb (136.986 kg)  11/25/15 300 lb (136.079 kg)  10/09/14 290 lb (131.543 kg)    Physical Exam  Constitutional: He is oriented to person, place, and time. He appears well-developed and well-nourished. No distress.  HENT:  Head: Normocephalic and atraumatic.  Right Ear: Hearing normal.  Left Ear: Hearing normal.  Nose: Nose normal.  Eyes: Conjunctivae and lids are normal. Right eye exhibits no discharge. Left eye exhibits no discharge. No scleral icterus.  Cardiovascular: Normal rate, regular rhythm and normal heart sounds.   Pulmonary/Chest: Effort normal.  No respiratory distress.  Genitourinary:  Testicle exam slight tenderness of the epididymis area. No hernia no other testicle lesions or tenderness  Musculoskeletal: Normal range of motion.  Neurological: He is alert and oriented to person, place, and time.  Skin: Skin is intact. No rash noted.  Psychiatric: He has a normal mood and affect. His speech is normal and behavior is normal. Judgment and thought content normal. Cognition and memory are normal.    Results for orders placed or performed in visit on 12/03/15  Male Executive Panel  Result Value Ref Range   Glucose 93 65 - 99 mg/dL   Uric Acid 8.8 (H) 3.7 - 8.6 mg/dL   BUN 12 6 - 24 mg/dL   Creatinine, Ser 1.13 0.76 - 1.27 mg/dL   GFR calc non Af Amer 76 >59 mL/min/1.73   GFR calc Af Amer 88 >59 mL/min/1.73   BUN/Creatinine Ratio 11 9 - 20   Sodium 138 134 - 144 mmol/L   Potassium 4.0 3.5 - 5.2 mmol/L   Chloride 95 (L) 96 - 106 mmol/L   Calcium 9.0 8.7 - 10.2 mg/dL   Phosphorus 3.4 2.5 - 4.5 mg/dL   Total Protein 6.3 6.0 - 8.5 g/dL   Albumin 4.1 3.5 - 5.5 g/dL   Globulin, Total 2.2 1.5 - 4.5 g/dL   Albumin/Globulin Ratio 1.9 1.2 - 2.2   Bilirubin Total 0.5 0.0 - 1.2 mg/dL   Alkaline Phosphatase 70 39 - 117 IU/L  LDH 219 121 - 224 IU/L   AST 34 0 - 40 IU/L   ALT 33 0 - 44 IU/L   GGT 28 0 - 65 IU/L   Iron 97 38 - 169 ug/dL   Cholesterol, Total 149 100 - 199 mg/dL   Triglycerides 255 (H) 0 - 149 mg/dL   HDL 42 >39 mg/dL   VLDL Cholesterol Cal 51 (H) 5 - 40 mg/dL   LDL Calculated 56 0 - 99 mg/dL   Chol/HDL Ratio 3.5 0.0 - 5.0 ratio units   Estimated CHD Risk 0.6 0.0 - 1.0  times avg.   TSH 2.960 0.450 - 4.500 uIU/mL   T4, Total 4.6 4.5 - 12.0 ug/dL   T3 Uptake Ratio 31 24 - 39 %   Free Thyroxine Index 1.4 1.2 - 4.9   Prostate Specific Ag, Serum 1.1 0.0 - 4.0 ng/mL   WBC 4.7 3.4 - 10.8 x10E3/uL   RBC 4.33 4.14 - 5.80 x10E6/uL   Hemoglobin 13.8 12.6 - 17.7 g/dL   Hematocrit 42.1 37.5 - 51.0 %   MCV 97 79 - 97 fL    MCH 31.9 26.6 - 33.0 pg   MCHC 32.8 31.5 - 35.7 g/dL   RDW 13.6 12.3 - 15.4 %   Platelets 173 150 - 379 x10E3/uL   Neutrophils 64 %   Lymphs 28 %   Monocytes 6 %   Eos 2 %   Basos 0 %   Neutrophils Absolute 3.0 1.4 - 7.0 x10E3/uL   Lymphocytes Absolute 1.3 0.7 - 3.1 x10E3/uL   Monocytes Absolute 0.3 0.1 - 0.9 x10E3/uL   EOS (ABSOLUTE) 0.1 0.0 - 0.4 x10E3/uL   Basophils Absolute 0.0 0.0 - 0.2 x10E3/uL   Immature Granulocytes 0 %   Immature Grans (Abs) 0.0 0.0 - 0.1 x10E3/uL      Assessment & Plan:   Problem List Items Addressed This Visit      Cardiovascular and Mediastinum   Essential hypertension    The current medical regimen is effective;  continue present plan and medications.        Other Visit Diagnoses    Epididymitis    -  Primary    Discussed care and treatment use of medications and cautions with antibiotics.    Relevant Orders    Urinalysis, Routine w reflex microscopic (not at [facility])       Reviewed elevated uric acid triglycerides patient will do better with diet and nutrition observe for further symptoms if not getting better will need to recheck. Follow up plan: Return for As scheduled.

## 2015-12-08 ENCOUNTER — Telehealth: Payer: Self-pay | Admitting: Podiatry

## 2015-12-08 NOTE — Telephone Encounter (Signed)
Pt's wife called about her husbands statement

## 2015-12-10 ENCOUNTER — Other Ambulatory Visit: Payer: Self-pay

## 2016-01-27 ENCOUNTER — Encounter: Payer: Self-pay | Admitting: Physician Assistant

## 2016-01-27 ENCOUNTER — Ambulatory Visit: Payer: Self-pay | Admitting: Physician Assistant

## 2016-01-27 VITALS — BP 130/70 | HR 95 | Temp 98.9°F

## 2016-01-27 DIAGNOSIS — L259 Unspecified contact dermatitis, unspecified cause: Secondary | ICD-10-CM

## 2016-01-27 MED ORDER — METHYLPREDNISOLONE 4 MG PO TBPK: ORAL_TABLET | ORAL | 0 refills | Status: DC

## 2016-01-27 NOTE — Progress Notes (Signed)
S: c/o rash on arms, legs, abdomen, similar sx recently when went to the beach, states he got little blisters that just wiped away, now areas are just itchy, saw a few bumps on his arms, none on legs, no fever/chills or new exposures  O: vitals wnl, nad, skin is dry, excoriated, areas are red from scratching, small hive like bumps on arms only, n/v intact  A: contact dermatitis  P: medrol dose pack, otc hydrocortisone cream, cetaphil

## 2016-03-02 ENCOUNTER — Ambulatory Visit (HOSPITAL_COMMUNITY): Payer: 59 | Admitting: Psychology

## 2016-03-02 DIAGNOSIS — F1020 Alcohol dependence, uncomplicated: Secondary | ICD-10-CM

## 2016-03-02 NOTE — Progress Notes (Signed)
Comprehensive Clinical Assessment (CCA) Note  03/02/2016 Alexander Ramsey [id number]  Visit Diagnosis:      ICD-9-CM ICD-10-CM   1. Alcohol use disorder, moderate, dependence (HCC) 303.90 F10.20      CCA Part One  Part One has been completed on paper by the patient.  (See scanned document in Chart Review)  CCA Part Two A  Intake/Chief Complaint:  CCA Intake With Chief Complaint CCA Part Two Date: 03/02/16 CCA Part Two Time: 1008 Chief Complaint/Presenting Problem: Alcohol dependence referred from EAP Patients Currently Reported Symptoms/Problems: "none", minor family problems, Work problems Collateral Involvement: wife drinks socially but now chooses to [employer] Strengths: supervision, interview, work-related Individual's Preferences: none Individual's Abilities: organized, speak my mind Type of Services Patient Feels Are Needed: Individual counseling  Mental Health Symptoms Depression:  Depression: N/A  Mania:  Mania: N/A  Anxiety:   Anxiety: N/A  Psychosis:  Psychosis: N/A  Trauma:  Trauma: N/A  Obsessions:  Obsessions: N/A  Compulsions:  Compulsions: N/A  Inattention:  Inattention: N/A  Hyperactivity/Impulsivity:  Hyperactivity/Impulsivity: N/A  Oppositional/Defiant Behaviors:  Oppositional/Defiant Behaviors: N/A  Borderline Personality:  Emotional Irregularity: N/A  Other Mood/Personality Symptoms:  Other Mood/Personality Symtpoms: none   Mental Status Exam Appearance and self-care  Stature:  Stature: Tall  Weight:  Weight: Overweight  Clothing:  Clothing: Casual, Neat/clean  Grooming:  Grooming: Normal  Cosmetic use:  Cosmetic Use: None  Posture/gait:  Posture/Gait: Normal  Motor activity:  Motor Activity: Not Remarkable  Sensorium  Attention:  Attention: Normal  Concentration:  Concentration: Normal  Orientation:  Orientation: X5  Recall/memory:  Recall/Memory: Normal  Affect and Mood  Affect:  Affect: Flat, Appropriate  Mood:     Relating   Eye contact:  Eye Contact: Normal  Facial expression:  Facial Expression: Responsive  Attitude toward examiner:  Attitude Toward Examiner: Defensive, Suspicious  Thought and Language  Speech flow:    Thought content:     Preoccupation:     Hallucinations:     Organization:     Transport planner of Knowledge:     Intelligence:     Abstraction:     Judgement:     Reality Testing:     Insight:     Decision Making:     Social Functioning  Social Maturity:  Social Maturity: Responsible  Social Judgement:  Social Judgement: Normal, "Fish farm manager  Stress  Stressors:  Stressors: Work  Electronics engineer:  Coping Ability: Resilient, Normal  Skill Deficits:     Supports:      Family and Psychosocial History: Family history Marital status: Married Number of Years Married: 15 What types of issues is patient dealing with in the relationship?: none Additional relationship information: none Are you sexually active?: Yes What is your sexual orientation?: heterosexual Has your sexual activity been affected by drugs, alcohol, medication, or emotional stress?: none Does patient have children?: Yes How many children?: 2 How is patient's relationship with their children?: good  Childhood History:  Childhood History By whom was/is the patient raised?: Both parents Additional childhood history information: father alcoholic, mother non-drinker/religious Description of patient's relationship with caregiver when they were a child: "good, social activities with mom and dad", played team sports Patient's description of current relationship with people who raised him/her: Mother living, good relationship How were you disciplined when you got in trouble as a child/adolescent?: "switch or belt" Does patient have siblings?: Yes Number of Siblings: 2 Description of patient's current relationship with siblings: sister- good, brother- "  alcoholic, not good," avoids contact Did patient suffer any  verbal/emotional/physical/sexual abuse as a child?: No Did patient suffer from severe childhood neglect?: No Has patient ever been sexually abused/assaulted/raped as an adolescent or adult?: No Was the patient ever a victim of a crime or a disaster?: No Witnessed domestic violence?: No Has patient been effected by domestic violence as an adult?: No  CCA Part Two B  Employment/Work Situation: Employment / Work Copywriter, advertising Employment situation: 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 What is the longest time patient has a held a job?: 25 Where was the patient employed at that time?: [employer] Has patient ever been in the [employer]?: No Has patient ever served in combat?: No  Education: Education Last Grade Completed: 14 Did Teacher, adult education From [employer]?: Yes Did Physicist, medical?: Yes What Type of College Degree Do you Have?: Media planner, dropped out after 1 year What Was Your Major?: Criminal Justice Did You Have Any Special Interests In School?: Criminal behavior Did You Have An Individualized Education Program (IIEP): No Did You Have Any Difficulty At School?: No  Religion: Religion/Spirituality Are You A Religious Person?: Yes What is Your Religious Affiliation?: Non-Denominational How Might This Affect Treatment?: na  Leisure/Recreation: Leisure / Recreation Leisure and Hobbies: Golf, hunting, fishing, camping  Exercise/Diet: Exercise/Diet Do You Exercise?: Yes What Type of Exercise Do You Do?: Run/Walk, Weight Training How Many Times a Week Do You Exercise?: 1-3 times a week Have You Gained or Lost A Significant Amount of Weight in the Past Six Months?: No Do You Follow a Special Diet?: No Do You Have Any  Trouble Sleeping?: Yes Explanation of Sleeping Difficulties: restlessness, wake up frequently, "lowerback issue"  CCA Part Two C  Alcohol/Drug Use: Alcohol / Drug Use Prescriptions: Citalopram, Metoprolol, Nexium, Lostartar Over the Counter: BC History of alcohol / drug use?: Yes Longest period of sobriety (when/how long): 2 weeks Negative Consequences of Use: Work / Youth worker Withdrawal Symptoms:  (none) Substance #1 Name of Substance 1: Alcohol- liquor  1 - Age of First Use: 16 1 - Amount (size/oz): 6-9 oz  1 - Frequency: daily 1 - Duration: nightly 1 - Last Use / Amount: 02/23/16 Substance #2 Name of Substance 2: Marijuana 2 - Age of First Use: [age] 2 - Frequency: occasional 2 - Duration: monthly 2 - Last Use / Amount: 50 yo   CCA Part Three  ASAM's:  Six Dimensions of Multidimensional Assessment  Dimension 1:  Acute Intoxication and/or Withdrawal Potential:     Dimension 2:  Biomedical Conditions and Complications:     Dimension 3:  Emotional, Behavioral, or Cognitive Conditions and Complications:     Dimension 4:  Readiness to Change:     Dimension 5:  Relapse, Continued use, or Continued Problem Potential:     Dimension 6:  Recovery/Living Environment:      Substance use Disorder (SUD) Substance Use Disorder (SUD)  Checklist Symptoms of Substance Use: Continued use despite having a persistent/recurrent physical/psychological problem caused/exacerbated by use, Continued use despite persistent or recurrent social, interpersonal problems, caused or exacerbated by use, Evidence of tolerance, Persistent desire or unsuccessful efforts to cut down or control use, Substance(s) often taken in large amounts or over longer times than was intended, Social, occupational, recreational activities given  up or reduced due to use  Social Function:  Social Functioning Social Maturity: Responsible Social Judgement: Normal, "Games developer"  Stress:  Stress Stressors: Work Coping Ability:  Resilient, Normal Patient Takes Medications The Music therapist?: Yes Priority Risk: Moderate Risk  Risk Assessment- Self-Harm Potential: Risk Assessment For Self-Harm Potential Thoughts of Self-Harm: No current thoughts Method: No plan Availability of Means: No access/NA  Risk Assessment -Dangerous to Others Potential: Risk Assessment For Dangerous to Others Potential Method: No Plan Availability of Means: No access or NA Notification Required: No need or identified person  DSM5 Diagnoses: Patient Active Problem List   Diagnosis Date Noted  . Essential hypertension 11/25/2015  . Depression 11/25/2015    Patient Centered Plan: Patient is on the following Treatment Plan(s):  Impulse Control  Recommendations for Services/Supports/Treatments: Recommendations for Services/Supports/Treatments Recommendations For Services/Supports/Treatments: CD-IOP Intensive Chemical Dependency Program  Visit Summary:  ORIENTATION TO CD-IOP: Patient is a white, married, male who presented as active and somewhat engaged in his orientation to CD-IOP. He has 2 children ages 29 and 30. He was well groomed, attentive, made strong eye contact and admitted to "having a drinking problem". Pt stated his last drink was 02/23/16 and he has not experienced any withdrawal symptoms since abstaining from alcohol. He reported that he is interested in seeking tx for alcoholism due to his family hx of alcoholism, and recently being confronted at work about his alcohol use. He has no previous tx for SA or MH. Patient stated that around 1 week ago he was called into work and had been drinking non-alcoholic beers. He was then asked to meet 2 days later in the morning to discuss the issue. At this meeting he arrived smelling of alcohol and his supervisor asked him to take time off to address this issue. Patient reports that he was drinking 10-12 beers nightly and is "trying to ween himself off alcohol" since he was  drinking 6-9 oz of whisky per night "for a few years". He stated that his drinking got problematic around 09-Oct-2008 when he began working 8-5pm. He reports that he does not drink at work, while driving, or out socially. He reports that his drinking was alone at home as a reward for hard work and to relieve stress from his work. 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 He reports that his father and his father's father were both alcoholics. His father passed away in October 10, 2003 from lung cancer. Pt mother is still living in [place]. Patient is the youngest of 4 children. One brother died of cancer at age 78. One brother is an active alcoholic who has sought tx before. Pt reports that he is estranged from his alcoholic brother and sees him "maybe once a year". He has a good relationship with his sister who lives with and takes care of pt's mother. Patient denies any abuse, neglect, trauma, or other mental health symptoms. He was experiencing PVC heartbeats and his primary care physician started him on a low level dose of anti-anxiety medication. Pt takes his medications as px. Patient shows some insight into his drinking but minimizes any consequences and negative effects of his alcohol use. Patient is motivated by "getting healthy and wanting to reach retirement". Patient will complete CD-IOP and wants his tx disclosed to his EAP representative. All paperwork was signed and a tour of the facility was given. Patient agreed to begin CD-IOP Wednesday, 03/03/16.  Referrals to Alternative Service(s): Referred to Alternative  Service(s):   Place:   Date:   Time:    Referred to Alternative Service(s):   Place:   Date:   Time:    Referred to Alternative Service(s):   Place:   Date:   Time:    Referred to Alternative Service(s):   Place:   Date:   Time:     Brandon Melnick

## 2016-03-03 ENCOUNTER — Other Ambulatory Visit (HOSPITAL_COMMUNITY): Payer: 59 | Attending: Psychiatry | Admitting: Psychology

## 2016-03-03 ENCOUNTER — Other Ambulatory Visit (HOSPITAL_COMMUNITY): Payer: Self-pay | Admitting: Medical

## 2016-03-03 DIAGNOSIS — F1020 Alcohol dependence, uncomplicated: Secondary | ICD-10-CM | POA: Insufficient documentation

## 2016-03-04 ENCOUNTER — Other Ambulatory Visit (HOSPITAL_COMMUNITY): Payer: 59 | Admitting: Psychology

## 2016-03-04 DIAGNOSIS — F1020 Alcohol dependence, uncomplicated: Secondary | ICD-10-CM

## 2016-03-06 LAB — PRESCRIPTION ABUSE MONITORING 17P, URINE
6-Acetylmorphine, Urine: NEGATIVE ng/mL
Amphetamine Scrn, Ur: NEGATIVE ng/mL
BARBITURATE SCREEN URINE: NEGATIVE ng/mL
BENZODIAZEPINE SCREEN, URINE: NEGATIVE ng/mL
BUPRENORPHINE, URINE: NEGATIVE ng/mL
CANNABINOIDS UR QL SCN: NEGATIVE ng/mL
COCAINE(METAB.)SCREEN, URINE: NEGATIVE ng/mL
Carisoprodol/Meprobamate, Ur: NEGATIVE ng/mL
Creatinine(Crt), U: 249.9 mg/dL (ref 20.0–300.0)
EDDP, URINE: NEGATIVE ng/mL
FENTANYL, URINE: NEGATIVE pg/mL
MDMA Screen, Urine: NEGATIVE ng/mL
MEPERIDINE SCREEN, URINE: NEGATIVE ng/mL
METHADONE SCREEN, URINE: NEGATIVE ng/mL
Nitrite Urine, Quantitative: NEGATIVE ug/mL
OXYCODONE+OXYMORPHONE UR QL SCN: NEGATIVE ng/mL
Opiate Scrn, Ur: NEGATIVE ng/mL
Ph of Urine: 5.6 (ref 4.5–8.9)
Phencyclidine Qn, Ur: NEGATIVE ng/mL
Propoxyphene Scrn, Ur: NEGATIVE ng/mL
SPECIFIC GRAVITY: 1.018
Tapentadol, Urine: NEGATIVE ng/mL
Tramadol Screen, Urine: NEGATIVE ng/mL

## 2016-03-07 ENCOUNTER — Other Ambulatory Visit: Payer: Self-pay | Admitting: Family Medicine

## 2016-03-09 ENCOUNTER — Encounter (HOSPITAL_COMMUNITY): Payer: Self-pay | Admitting: Psychology

## 2016-03-09 DIAGNOSIS — F1020 Alcohol dependence, uncomplicated: Secondary | ICD-10-CM | POA: Insufficient documentation

## 2016-03-09 NOTE — Progress Notes (Signed)
    Daily Group Progress Note  Program: CD-IOP   03/09/2016 Alexander Ramsey [id number]  Diagnosis:  No diagnosis found.   Sobriety Date: 02/23/16  Group Time: 1-2:30 pm  Participation Level: Minimal  Behavioral Response: Appropriate and Sharing  Type of Therapy: Process Group  Interventions: Strength-based  Topic: Process: the first half of group was spent in process. Members disclosed the ways they have supported their sobriety since we last met. These could include attending meetings, step work, meetings with sponsor or others in recovery, but also healthy activities, like the gym or walking, that they may have stopped doing in the past. A new group member was present and she introduced herself. She was shy and reserved and admitted she was very nervous. Random drug tests were collected. The program director met with group members during the session, including the new member.  Group Time: 2:30-4 pm  Participation Level: Active  Behavioral Response: Sharing  Type of Therapy: Psycho-education Group  Interventions: Solution Focused  Topic: Psycho-Ed: The Pros and Cons of Addiction versus Sobriety.  The second half of group was spent in a psycho-e. Four squares were drawn on the board and members were asked to identify the pros and cons of using versus being clean and sober. The discussion was lively and animated. The new member was much more active and easily identified at least 4 different advantages to sobriety. There were many advantages to sobriety versus active addiction at the end of this session.   Summary: The patient was new to the group today. He was quiet and shared little of himself during process. When asked to introduce himself to the group, the patient reported he had been recommended to seek treatment after he was found to have a blood alcohol positive content at work one day. He insisted that he never drank on the job, but admitted he had been drinking for a long  time and knew he drank too much. When asked about his intake, the patient reported he used to drink bout 10-12 beers per day, but changed to [place]. He reported drinking about 6-9 ounces per night. He shared that there is a lot of addiction in his family on the paternal side of the family. The patient reported he has a 49 yo daughter and a 30 yo son. He is spending more time with the son. The patient explained he used to get home from work and begin drinking and sit in front of the TV for the remainder of the evening.  Now he is doing things he never used to do. The patient denied any family problems as a result of his drinking. The patient was withdrawn, which is typical in the early sessions. He identified engaging in hobbies as a plus or pro of sobriety. The patient reported he is doing more things around the house and fixing things in his wood working space. He responded well to this intervention, but he is minimizing the impact of his drinking on his daily life and his family. We will continue to invite him to open up and share more about his inner self.    UDS collected: Yes Results: negative  AA/NA attended?: No, but he is new to the program  Sponsor?: No   Brandon Melnick, [place] 03/09/2016 11:07 AM

## 2016-03-09 NOTE — Progress Notes (Signed)
    Daily Group Progress Note  Program: CD-IOP   03/09/2016 Alexander Ramsey [id number]  Diagnosis:  Alcohol use disorder, moderate, dependence ([place])   Sobriety Date: 02/23/16  Group Time: 1-2:30  Participation Level: Active  Behavioral Response: Appropriate and Sharing  Type of Therapy: Process Group  Interventions: Motivational Interviewing and Solution Focused  Topic: Counselors met with patients for group process session. Members shared about their challenges and successes related to their recovery. Members also discussed 12 Step meeting attendance and weekend plans for the upcoming Labor Day holiday. All patients were active and engaged. One new member was present for group.   Group Time: 2:30-4  Participation Level: Active  Behavioral Response: Appropriate and Sharing  Type of Therapy: Psycho-education Group  Interventions: CBT, Motivational Interviewing and Solution Focused  Topic: Counselors met with patients for group psychoeducation session on the topic of "family of origin ([place]) and addiction". Patients did a mindfulness exercise remembering a past family food/ritual in order to stimulate conversation about Alexander Ramsey. Patients completed an "I am from" poem which detailed their childhood and family habits. Patients shared openly about their experience in childhood and how it impacted their addiction and relationships in their adulthood.    Summary: Patient presented as active and engaged in group and shared openly with others. Patient reports no significant alcohol cravings but admits to thinking about alcohol daily. Pt rt securing a sponsor with 12 years sobriety who is helping him begin step work. Pt rt on his family of origin, that he was "an accident" and the youngest of his siblings. He reported being inspired by his dad who was an active Designer, jewellery. Pt rt on his upcoming beach trip and his plan to attend an [place] meeting while on vacation. Patient appears to be  compliant and forthcoming in tx.Youlanda Roys, LPC-A   UDS collected: No Results: negative  AA/NA attended?: YesWednesday  Sponsor?: Yes   Brandon Melnick, LCAS 03/09/2016 11:14 AM

## 2016-03-10 ENCOUNTER — Other Ambulatory Visit (HOSPITAL_COMMUNITY): Payer: Self-pay | Admitting: Medical

## 2016-03-10 ENCOUNTER — Encounter (HOSPITAL_COMMUNITY): Payer: Self-pay | Admitting: Psychology

## 2016-03-10 ENCOUNTER — Encounter (HOSPITAL_COMMUNITY): Payer: Self-pay | Admitting: Medical

## 2016-03-10 ENCOUNTER — Other Ambulatory Visit (HOSPITAL_COMMUNITY): Payer: 59 | Attending: Psychiatry | Admitting: Psychology

## 2016-03-10 VITALS — BP 132/76 | HR 67 | Ht 73.5 in | Wt 311.0 lb

## 2016-03-10 DIAGNOSIS — F1020 Alcohol dependence, uncomplicated: Secondary | ICD-10-CM | POA: Diagnosis present

## 2016-03-10 DIAGNOSIS — F19982 Other psychoactive substance use, unspecified with psychoactive substance-induced sleep disorder: Secondary | ICD-10-CM | POA: Diagnosis not present

## 2016-03-10 DIAGNOSIS — F1994 Other psychoactive substance use, unspecified with psychoactive substance-induced mood disorder: Secondary | ICD-10-CM | POA: Insufficient documentation

## 2016-03-10 DIAGNOSIS — Z6372 Alcoholism and drug addiction in family: Secondary | ICD-10-CM | POA: Insufficient documentation

## 2016-03-10 DIAGNOSIS — F341 Dysthymic disorder: Secondary | ICD-10-CM | POA: Insufficient documentation

## 2016-03-10 DIAGNOSIS — I10 Essential (primary) hypertension: Secondary | ICD-10-CM | POA: Insufficient documentation

## 2016-03-10 DIAGNOSIS — F329 Major depressive disorder, single episode, unspecified: Secondary | ICD-10-CM

## 2016-03-10 MED ORDER — TRAZODONE HCL 50 MG PO TABS: 50.0000 mg | ORAL_TABLET | Freq: Every evening | ORAL | 1 refills | Status: DC | PRN

## 2016-03-10 NOTE — Progress Notes (Signed)
Psychiatric Initial Adult Assessment   Patient Identification: Alexander Ramsey MRN:  [id number] Date of Evaluation:  03/10/2016 Referral [place] Chief Complaint:   Visit Diagnosis:    ICD-9-CM ICD-10-CM   1. Alcohol use disorder, severe, dependence ([facility]) 303.90 F10.20   2. Depression, reactive 300.4 F34.1   3. Dysfunctional family due to alcoholism V61.41 Z63.72   4. Essential hypertension 401.9 I10   5. Morbid obesity, unspecified obesity type ([employer] [facility]) 278.01 E66.01   6. Substance induced mood disorder (HCC) 292.84 F19.94   7. Substance-induced sleep disorder (HCC) 292.85 F19.982    E980.5 T50.904A     History of Present Illness:    Associated Signs/Symptoms: Depression Symptoms:  See PHQ 9-Mild score (Hypo) Manic Symptoms:  NA Anxiety Symptoms:   Agoraphobia,NA Excessive Worry,Denies Panic Symptoms,In past related to PVCs Obsessive Compulsive Symptoms:   None,, Social Anxiety,NONE Specific Phobias,NONE Psychotic Symptoms:  NONE PTSD Symptoms: Had a traumatic exposure:  work related/ sudden death of older brother when he was 17/Alcoholic brother he refuses to have anything to do with Had a traumatic exposure in the last month:  no Re-experiencing:  None Hypervigilance:  Negative Avoidance:  Decreased Interest/Participation   Past Psychiatric History: NONE  Previous Psychotropic Medications: Yes Celexa 40 mg (Stress related RX-anxiety) due to heart problems-PVCs with panic-5 yrs ago at [facility] dx'd and put on Celexa   Substance Abuse History in the last 12 months:   Alcohol/Drug Use: Alcohol / Drug Use Prescriptions: Citalopram, Metoprolol, Nexium, Lostartar Over the Counter: BC History of alcohol / drug use?: Yes Longest period of sobriety (when/how long): 2 weeks Negative Consequences of Use: Work / Youth worker Withdrawal Symptoms:  (none) Substance #1 Name of Substance 1: Alcohol- liquor  1 - Age of First Use: 16 1 - Amount (size/oz): 6-9 oz   1 - Frequency: daily 1 - Duration: nightly 1 - Last Use / Amount: 02/23/16 Substance #2 Name of Substance 2: Marijuana 2 - Age of First Use: [age] 2 - Frequency: occasional 2 - Duration: monthly 2 - Last Use / Amount: 50 yo    Consequences of Substance Abuse: Medical Consequences:  Hypertension;Hypertriglyceredemia Legal Consequences:  NA Family Consequences:  Wife and children have discussed their concerns Employment Consequences:Reduction in rank and pay  Blackouts:  NO DT's: NA Withdrawal Symptoms:   Hungover feeling after bout of heavy drinking;Insomnia  Past Medical History:  Past Medical History:  Diagnosis Date  . Diverticulosis   . GERD (gastroesophageal reflux disease)   . IBS (irritable bowel syndrome)   . Sleep apnea     Past Surgical History:  Procedure Laterality Date  . COLONOSCOPY  2002 and 2004  . FOOT SURGERY Left Decemer 23, 2106   "Jones Fracture"    Family Psychiatric History: Alcoholism Father and PGF  Family History:  Family History  Problem Relation Age of Onset  . Hypertension Mother   . Sleep apnea Mother   . Cancer Father   . Hypertension Sister   . Cancer Brother   . Hypertension Brother   . Sleep apnea Brother     Social History:   Social History   Social History  . Marital status: Married    Spouse name: N/A  . Number of children: N/A  . Years of education: N/A   Social History Main Topics  . Smoking status: Former Smoker    Quit date: 11/25/2003  . Smokeless tobacco: Current User    Types: Snuff  . Alcohol use 0.0 oz/week  .  Drug use: No  . Sexual activity: Not Asked   Other Topics Concern  . None   Social History Narrative  . None    Additional Social History:   Allergies:  No Known Allergies  Metabolic Disorder Labs: No results found for: HGBA1C, MPG No results found for: PROLACTIN Lab Results  Component Value Date   CHOL 149 12/03/2015   TRIG 255 (H) 12/03/2015   HDL 42 12/03/2015   CHOLHDL 3.5  12/03/2015   LDLCALC 56 12/03/2015     Current Medications: Current Outpatient Prescriptions  Medication Sig Dispense Refill  . citalopram (CELEXA) 40 MG tablet Take 1 tablet (40 mg total) by mouth daily. 30 tablet 6  . esomeprazole (NEXIUM) 40 MG capsule Take 1 capsule (40 mg total) by mouth daily. 30 capsule 6  . losartan-hydrochlorothiazide (HYZAAR) 50-12.5 MG tablet TAKE 1 TABLET BY MOUTH EVERY DAY 30 tablet 0  . methylPREDNISolone (MEDROL DOSEPAK) 4 MG TBPK tablet Take 6 pills on day one then decrease by 1 pill each day 21 tablet 0  . metoprolol succinate (TOPROL-XL) 50 MG 24 hr tablet Take 1 tablet (50 mg total) by mouth daily. Take with or immediately following a meal. 30 tablet 6  . traZODone (DESYREL) 50 MG tablet Take 1 tablet (50 mg total) by mouth at bedtime and may repeat dose one time if needed. 60 tablet 1   No current facility-administered medications for this visit.     Neurologic: Headache: Negative Seizure: Negative Paresthesias:Negative  Musculoskeletal: Strength & Muscle Tone: within normal limits Gait & Station: normal Patient leans: N/A  Psychiatric Specialty Exam: Review of Systems  Constitutional: Positive for malaise/fatigue (insomnia). Negative for chills, diaphoresis, fever and weight loss.  HENT: Positive for congestion (PND). Negative for ear discharge, ear pain, hearing loss, nosebleeds, sore throat and tinnitus.        Sinusitis  Eyes: Negative for blurred vision, double vision, photophobia, pain, discharge and redness.  Respiratory: Negative for cough, hemoptysis, sputum production, shortness of breath, wheezing and stridor.   Cardiovascular: Positive for palpitations (Occasional PVC). Negative for chest pain, orthopnea, claudication, leg swelling and PND.  Gastrointestinal: Negative for abdominal pain, blood in stool, constipation, diarrhea, heartburn, melena, nausea and vomiting.       Colonoscopy 8-9 yrs neg  Genitourinary: Negative for  dysuria, flank pain, frequency, hematuria and urgency.  Musculoskeletal: Positive for back pain (Chronic LBP /Sciatica RT), falls and joint pain (knees and ankles/?DJD). Negative for myalgias and neck pain.  Skin: Negative for itching and rash.  Neurological: Negative for dizziness, tingling, tremors, sensory change, speech change, focal weakness, seizures, loss of consciousness, weakness and headaches.  Endo/Heme/Allergies: Negative for environmental allergies and polydipsia. Does not bruise/bleed easily.  Psychiatric/Behavioral: Positive for depression (PHQ 9 sxcore 5 mild), memory loss and substance abuse (Alcohol). Negative for hallucinations and suicidal ideas. The patient is nervous/anxious (Mild) and has insomnia.     Blood pressure 132/76, pulse 67, height 6' 1.5" (1.867 m), weight (!) 311 lb (141.1 kg).Body mass index is 40.48 kg/m.  General Appearance: Well Groomed  Eye Contact:  Good  Speech:  Clear and Coherent  Volume:  Normal  Mood:  Dysphoric  Affect:  Congruent  Thought Process:  Coherent  Orientation:  Full (Time, Place, and Person)  Thought Content:  WDL, Logical and milo Rumination related to brother's death;police work  Suicidal Thoughts:  Denies  Homicidal Thoughts:  Denies  Memory:  Negative  Judgement:  Intact except for alcohol use disorder dependence  Insight:  Lacking  Psychomotor Activity:  Normal  Concentration:  Concentration: Good and Attention Span: Good  Recall:  Good  Fund of Knowledge:Good  Language: Good  Akathisia:  NA  Handed:  Right  AIMS (if indicated):  NA  Assets:  Financial Resources/Insurance Housing Resilience Social Support Talents/Skills Transportation Vocational/Educational  ADL's:  Intact  Cognition: WNL  Sleep:  Insomnia off alcohol    Treatment Plan Summary: Treatment Plan/Recommendations:  Plan of Care: [place] CD IOP ([facility])  Laboratory:  UDS per protocol  Psychotherapy:CD IOP Group;Individual;Family  Medications:  See list  Routine PRN Medications:  NA  Consultations: None at this time  Safety Concerns: Relapse  Other:  NA     Darlyne Russian, PA-C 9/6/20173:53 PM

## 2016-03-11 ENCOUNTER — Other Ambulatory Visit (HOSPITAL_COMMUNITY): Payer: 59 | Admitting: Psychology

## 2016-03-11 DIAGNOSIS — F1020 Alcohol dependence, uncomplicated: Secondary | ICD-10-CM

## 2016-03-12 LAB — PRESCRIPTION ABUSE MONITORING 17P, URINE
6-Acetylmorphine, Urine: NEGATIVE ng/mL
Amphetamine Scrn, Ur: NEGATIVE ng/mL
BARBITURATE SCREEN URINE: NEGATIVE ng/mL
BENZODIAZEPINE SCREEN, URINE: NEGATIVE ng/mL
BUPRENORPHINE, URINE: NEGATIVE ng/mL
CANNABINOIDS UR QL SCN: NEGATIVE ng/mL
CARISOPRODOL/MEPROBAMATE, UR: NEGATIVE ng/mL
COCAINE(METAB.)SCREEN, URINE: NEGATIVE ng/mL
Creatinine(Crt), U: 49.5 mg/dL (ref 20.0–300.0)
EDDP, Urine: NEGATIVE ng/mL
FENTANYL, URINE: NEGATIVE pg/mL
MDMA Screen, Urine: NEGATIVE ng/mL
MEPERIDINE SCREEN, URINE: NEGATIVE ng/mL
Methadone Screen, Urine: NEGATIVE ng/mL
Nitrite Urine, Quantitative: NEGATIVE ug/mL
OPIATE SCREEN URINE: NEGATIVE ng/mL
OXYCODONE+OXYMORPHONE UR QL SCN: NEGATIVE ng/mL
Ph of Urine: 5.2 (ref 4.5–8.9)
Phencyclidine Qn, Ur: NEGATIVE ng/mL
Propoxyphene Scrn, Ur: NEGATIVE ng/mL
SPECIFIC GRAVITY: 1.011
TAPENTADOL, URINE: NEGATIVE ng/mL
Tramadol Screen, Urine: NEGATIVE ng/mL

## 2016-03-12 LAB — ETHYL GLUCURONIDE, URINE: ETHYL GLUCURONIDE SCREEN, UR: NEGATIVE ng/mL

## 2016-03-12 NOTE — Progress Notes (Signed)
    Daily Group Progress Note  Program: CD-IOP   03/12/2016 Alexander Ramsey [id number]  Diagnosis:  Alcohol use disorder, severe, dependence ([place])   Sobriety Date: 02/23/16  Group Time: 1-2:30  Participation Level: Active  Behavioral Response: Appropriate and Sharing  Type of Therapy: Process Group  Interventions: Motivational Interviewing and Solution Focused  Topic: Counselor met with patients for 1.5 hour group process session. Patients were active and engaged. Topics included open sharing, linking patient's experiences, and encouraging pt in their recovery. Drug test was collected from 1 member. One member self-reported that she had used last night. Counselor spent significant time addressing relapse prevention and processing the relapse with the group. PHQ9 and GAD7 were administered to all patients.   Group Time: 2:30-4  Participation Level: Active  Behavioral Response: Appropriate and Sharing  Type of Therapy: Psycho-education Group  Interventions: CBT, Motivational Interviewing and Meditation: Mindfulness based relapse prevention  Topic: Counselors met with patients for 1.5 hour group psychoeducation session. Patients were active and engaged. Topics included mindfulness based relapse prevention, the difference between thoughts and feelings, and becoming aware of craving feelings and negative emotions in recovery. Patients did a 10 min guided imagery which taught how to become aware of thoughts instead of being controlled by them.    Summary: Patient was active, engaged and shared openly when asked to check in. Pt st he had been to the beach last weekend and found an [facility] meeting there which he attended with his wife. He st that he enjoyed the meeting and met "amazing people" who encouraged him to begin the 12 steps. Pt shared about his past alcohol use, admitting he was drinking around 1.5 gal of liquor/week. The disclosure may signal a increase in patient's honesty. Pt  shared that he acquired a sponsor and has eliminated his "biased opinion" of AA members. Patient remained silent for most of session after his check in, which may be due to his recent start date. Patient shared that he picked up a white starter chip and hopes to attend and AA retreat later this month. Patient shared that yesterday he was disappointed after discovering he would get a demotion at his job due to his alcoholism. Youlanda Roys, LPCA   UDS collected: No Results: negative  AA/NA attended?: No  Sponsor?: Yes   Brandon Melnick, LCAS 03/12/2016 1:51 PM

## 2016-03-15 ENCOUNTER — Other Ambulatory Visit (HOSPITAL_COMMUNITY): Payer: Self-pay | Admitting: Medical

## 2016-03-15 ENCOUNTER — Encounter (HOSPITAL_COMMUNITY): Payer: Self-pay | Admitting: Psychology

## 2016-03-15 ENCOUNTER — Other Ambulatory Visit (HOSPITAL_COMMUNITY): Payer: 59 | Admitting: Psychology

## 2016-03-15 DIAGNOSIS — F1020 Alcohol dependence, uncomplicated: Secondary | ICD-10-CM

## 2016-03-17 ENCOUNTER — Encounter (HOSPITAL_COMMUNITY): Payer: Self-pay | Admitting: Psychology

## 2016-03-17 ENCOUNTER — Other Ambulatory Visit (HOSPITAL_COMMUNITY): Payer: 59

## 2016-03-17 LAB — PRESCRIPTION ABUSE MONITORING 17P, URINE
6-Acetylmorphine, Urine: NEGATIVE ng/mL
AMPHETAMINE SCREEN URINE: NEGATIVE ng/mL
BARBITURATE SCREEN URINE: NEGATIVE ng/mL
BENZODIAZEPINE SCREEN, URINE: NEGATIVE ng/mL
BUPRENORPHINE, URINE: NEGATIVE ng/mL
CANNABINOIDS UR QL SCN: NEGATIVE ng/mL
CARISOPRODOL/MEPROBAMATE, UR: NEGATIVE ng/mL
COCAINE(METAB.)SCREEN, URINE: NEGATIVE ng/mL
Creatinine(Crt), U: 20 mg/dL (ref 20.0–300.0)
EDDP, Urine: NEGATIVE ng/mL
Fentanyl, Urine: NEGATIVE pg/mL
MDMA Screen, Urine: NEGATIVE ng/mL
Meperidine Screen, Urine: NEGATIVE ng/mL
Methadone Screen, Urine: NEGATIVE ng/mL
Nitrite Urine, Quantitative: NEGATIVE ug/mL
OPIATE SCREEN URINE: NEGATIVE ng/mL
OXYCODONE+OXYMORPHONE UR QL SCN: NEGATIVE ng/mL
PROPOXYPHENE SCREEN URINE: NEGATIVE ng/mL
Ph of Urine: 6.4 (ref 4.5–8.9)
Phencyclidine Qn, Ur: NEGATIVE ng/mL
SPECIFIC GRAVITY: 1.005
TAPENTADOL, URINE: NEGATIVE ng/mL
TRAMADOL SCREEN, URINE: NEGATIVE ng/mL

## 2016-03-17 LAB — ETHYL GLUCURONIDE, URINE: ETHYL GLUCURONIDE SCREEN, UR: NEGATIVE ng/mL

## 2016-03-17 NOTE — Progress Notes (Signed)
CD-IOP: Excused Absence. Patient is excused from group today. He had mandatory training at [employer] in [place]. He will return tomorrow for group session.

## 2016-03-17 NOTE — Progress Notes (Signed)
Daily Group Progress Note  Program: CD-IOP   03/17/2016 [name] [id number]  Diagnosis:  Alcohol use disorder, severe, dependence ([place])  Depression, reactive  Dysfunctional family due to alcoholism  Essential hypertension  Morbid obesity, unspecified obesity type ([place])  Substance induced mood disorder ([place])  Substance-induced sleep disorder ([place])   Sobriety Date: 02/23/16  Group Time: 1-2:30 pm  Participation Level: Active  Behavioral Response: Appropriate and Sharing  Type of Therapy: Process Group  Interventions: Strength-based  Topic: Process: the first half of group was spent in process. Members shared about the past holiday weekend and the things that they had done to support their recovery. Two new group members were present and introduced themselves during the session today. The drug tests collected from last week were returned. A member was scheduled to graduate today, but her drug test came back positive for alcohol. She processed the return to use (twice) with the group. Drug tests were collected from all group members today. The program director met with 2 group members.   Group Time: 2:30-4pm  Participation Level: Active  Behavioral Response: Appropriate  Type of Therapy: Psycho-education Group  Interventions: Solution Focused  Topic: Psycho-Ed: Jenga. The second half of group was spent in a psycho-ed utilizing the game [place]. Different feelings, from the Feeling Wheel, were written on each piece and members picked out a piece from the structure and then shared the word with fellow group members. Each member used the feeling word they had chosen in a sentence describing that feeling in their childhood. The session was emotional, poignant and revealing. Members responded well to this intervention and the new group members shared openly about their lives in addiction.    Summary: The patient was upbeat and revealing in group today. He reported  he had attended 2 AA meetings while at the beach. He and his wife had attended a meeting on Friday and Saturday morning in [place], about 30 minutes from where they were staying. The patient admitted the meetings had been very revealing and his stereotypes of what alcoholics looked like was totally wrong. He had found the men and women at the meetings very welcoming and he had realized that they were like him. The patient quickly pointed out that in many instances, he had not had some of the negative consequences that many of the other people had had, but he used the word, "Yet". He presented as very enthusiastic and explained that he and his wife are going to attend a Retreat at the end of the month. The patient had also secured a temporary sponsor. His quick efforts to get into the program was applauded. The [place] and he will attend those meetings every time he goes. The patient met with the program director during this half of group. During the psycho-ed, the patient pulled the [place] piece and his word was "mad'. He stated he was 'mad' at first when he was informed earlier today that he had been demoted and would experience a 10% reduction in salary. He admitted, though, that his anger dissipated quickly because he had no one to blame but himself. The patient responded well to the session and revealed a new level or depth of self-disclosure. Now that he has attended some [facility] meetings, he seems more at peace and accepting that he is an alcoholic.    UDS collected: Yes Results: negative  AA/NA attended?: YesSaturday and Sunday  Sponsor?: No, but he is very new to recovery   Brandon Melnick, [place] 03/17/2016 9:08 AM

## 2016-03-18 ENCOUNTER — Other Ambulatory Visit (HOSPITAL_COMMUNITY): Payer: 59

## 2016-03-18 ENCOUNTER — Telehealth (HOSPITAL_COMMUNITY): Payer: Self-pay | Admitting: Psychology

## 2016-03-18 NOTE — Progress Notes (Signed)
Alexander Ramsey is a 50 y.o. male patient. CD-IOP: Treatment Planning Session. Met with the patient at the conclusion of the group session today. I complimented the patient on his increasing disclosure of feelings in the group. He has never been in any sort of treatment or group therapy before and he was fairly withdrawn in the first few sessions. He seems to be opening up and today he shared about his frustrations and anger about work. He had admitted in group that he no longer felt the pressure and stress since he has been demoted. "It's a relief", he had shared. I explained the importance of identifying goals for treatment and the patient was very receptive to this. He agreed that the #1 goal of treatment is to stay sober. He has done well since he began the program and has not had any alcohol August 21st. He has admitted that he feels much better physically and mentally. The patient has attended some [facility] meetings and found them much more pleasant and agreeable than he had anticipated. He admitted that he had some stereotypes about what the meetings and people would be like, but he was pleasantly surprised to find such welcoming and friendly people. The patient stated he understands that developing new relationships and learning from others who have attained years of sobriety is important. When asked about any other goals he wanted to identify, the patient identified his desire to resolve his problem around work that his alcohol use had created. He wants to successfully complete this program and meet the requirements that his employer has placed on him. The patient agreed that staying sober and compliant in this program is what he must do to get this issue resolved. I asked him a little more about his drinking and the patient reported he would drink whiskey with diet sprite. At first it was 4-5 drinks, but the patient admitted that as his tolerance grew, he began drinking more and at times it got above 12 good  sized drinks. The progressive nature of this disease became more and more apparent to him.The documentation was reviewed, signed and the treatment plan completed accordingly. We will continue to meet weekly for individual sessions. The patient's sobriety date remains 8/21.         Brandon Melnick, LCAS

## 2016-03-19 ENCOUNTER — Encounter (HOSPITAL_COMMUNITY): Payer: Self-pay | Admitting: Psychology

## 2016-03-19 NOTE — Progress Notes (Signed)
    Daily Group Progress Note  Program: CD-IOP   03/19/2016 [name] [id number]  Diagnosis:  No diagnosis found.   Sobriety Date: 02/23/16  Group Time: 1-2:30 pm  Participation Level: Active  Behavioral Response: Appropriate and Sharing  Type of Therapy: Process Group  Interventions: Supportive  Topic: Counselors met with patients to process recovery-related activities they had engaged in since the prior meeting. Several of the newer group members met with the program director to discuss medication management. One new patient joined the group. He shared about himself and his addiction. All members were active and engaged in the discussion concerning recovery from mind-altering drugs and alcohol.   Group Time: 2:30-4 pm  Participation Level: Active  Behavioral Response: Appropriate  Type of Therapy: Psycho-education Group  Interventions: Strength-based  Topic: Core Beliefs: Counselors facilitated a discussion regarding negative core beliefs that group members had internalized. Often these negative core beliefs fuel or trigger a return to use or relapse. The counselors helped members identify these beliefs and began discussing how to refute them. Most members were active and engaged. Drug tests were collected today.    Summary: Patient presented as active and engaged in session. He arrived wearing his uniform, as this was his first day back on the job. He reported feeling "happier now" in his job than he had in years due to lightened workplace responsibilities. The patient reported that his workplace stress had triggered his excessive drinking. The patient discussed the negative feelings he has about his job and he disclosed some very strong feelings, including being very angry, about the injustices and 'politics' at his workplace. The counselors encouraged him to continue to express/process them in the coming weeks. The patient reported he was able to go camping with  friends and was able to maintain his sobriety. Although he was not able to attend any meetings, he did talk to his sponsor several times. Pt was able to identify a negative belief, which was about his weight, which he admits is high right now. When asked if he had been overweight as a child, the patient denied it and noted that he had been in tremendous shape in his youth, playing sports. The counselor explained that core beliefs are formed early in one's life, typically in childhood. The patient insisted he had had a good childhood with no negative feelings about himself. It was noted that he had grown up in an alcoholic household with his father being alcoholic and his grandfather being a 'bad alcoholic'. Despite this knowledge, the patient insisted he had been fine in his youth. This patient has displayed a degree of denial about himself and is distanced from some of his emotions. We will work to get him to open up about the pain of his life in the weeks ahead.  A drug test was collected from the patient.    UDS collected: Yes Results: negative  AA/NA attended?: No  Sponsor?: No   Brandon Melnick, LCAS 03/19/2016 9:25 AM

## 2016-03-22 ENCOUNTER — Other Ambulatory Visit (HOSPITAL_COMMUNITY): Payer: Self-pay | Admitting: Medical

## 2016-03-22 ENCOUNTER — Other Ambulatory Visit (HOSPITAL_COMMUNITY): Payer: 59 | Admitting: Psychology

## 2016-03-22 DIAGNOSIS — F1020 Alcohol dependence, uncomplicated: Secondary | ICD-10-CM | POA: Diagnosis not present

## 2016-03-23 NOTE — Progress Notes (Signed)
    Daily Group Progress Note  Program: CD-IOP   03/23/2016 [name] [id number]  Diagnosis:  Alcohol use disorder, severe, dependence ([place])   Sobriety Date: 02/23/16  Group Time: 1-2:30  Participation Level: Active  Behavioral Response: Appropriate, Sharing, Blaming and Agitated  Type of Therapy: Process Group  Interventions: CBT and Motivational Interviewing  Topic: Patients met with counselors and intern for 1.5 hour group processing session. Counselors used open questions and reflection to help patients discuss their thoughts and feelings related to recovery from mind-altering drugs and alcohol. One new patient was present for his first group and introduced himself to the group. One patient met with program director for his orientation. UDS were collected from some group members. One patient discussed, at length, a conflict he had with his wife concerning finances and trust.     Group Time: 2:30-4  Participation Level: Active  Behavioral Response: Appropriate and Sharing  Type of Therapy: Psycho-education Group  Interventions: Motivational Interviewing, Solution Focused and Reframing  Topic: Patients met with counselors and intern for 1.5 hour group psychoeducation session. Counselor and intern led activity on "the wheel of life" and seeking balance in recovery. Patients filled out a wheel of life in response to 36 psychologically-based questions in areas of finance, social, intellectual, emotional, spiritual, and physical. Patients reported that they enjoyed the session and found the questions to be helpful for revealing "blind spots" in their life. Counselors gave patients feedback on constructing goals to help fix their "problems".    Summary: Patient was active and engaged in group. Patient presented as heavily critical towards his wife who he had recently fought with over a miscommunication about time/schedule. Patient shared that he has lost the ability to  trust his wife since she "hid credit card debt" a few years ago. Patient stated he "blew his stack then felt a lot better". Counselor tried to assess patient's motivation to change his negative attitude towards his wife, which remains ambivalent. Counselor posed the idea of a couple's session with the counselor, patient, and patient's wife. He seemed intrigued but not completely compliant. Patient stated he attended 2 AA meetings since last week and counselor reminded him he needed to attend 4/week. Patient reported no cravings for alcohol. Patient requested drug test results to give to his work. Youlanda Roys, LPCA   UDS collected: No Results: negative  AA/NA attended?: No  Sponsor?: Yes   Brandon Melnick, LCAS 03/23/2016 2:00 PM

## 2016-03-24 ENCOUNTER — Other Ambulatory Visit (HOSPITAL_COMMUNITY): Payer: 59 | Admitting: Psychology

## 2016-03-24 ENCOUNTER — Other Ambulatory Visit (HOSPITAL_COMMUNITY): Payer: Self-pay | Admitting: Medical

## 2016-03-24 DIAGNOSIS — Z6372 Alcoholism and drug addiction in family: Secondary | ICD-10-CM

## 2016-03-24 DIAGNOSIS — F1020 Alcohol dependence, uncomplicated: Secondary | ICD-10-CM

## 2016-03-25 ENCOUNTER — Other Ambulatory Visit (HOSPITAL_COMMUNITY): Payer: 59 | Admitting: Psychology

## 2016-03-25 DIAGNOSIS — F1020 Alcohol dependence, uncomplicated: Secondary | ICD-10-CM

## 2016-03-25 DIAGNOSIS — Z6372 Alcoholism and drug addiction in family: Secondary | ICD-10-CM

## 2016-03-28 LAB — TOXASSURE SELECT,+ANTIDEPR,UR

## 2016-03-29 ENCOUNTER — Other Ambulatory Visit (HOSPITAL_COMMUNITY): Payer: 59 | Admitting: Psychology

## 2016-03-29 DIAGNOSIS — Z6372 Alcoholism and drug addiction in family: Secondary | ICD-10-CM

## 2016-03-29 DIAGNOSIS — F329 Major depressive disorder, single episode, unspecified: Secondary | ICD-10-CM

## 2016-03-29 DIAGNOSIS — F1020 Alcohol dependence, uncomplicated: Secondary | ICD-10-CM | POA: Diagnosis not present

## 2016-03-30 ENCOUNTER — Encounter (HOSPITAL_COMMUNITY): Payer: Self-pay | Admitting: Psychology

## 2016-03-30 NOTE — Progress Notes (Signed)
    Daily Group Progress Note  Program: CD-IOP   03/30/2016 [name] [id number]  Diagnosis:  No diagnosis found.   Sobriety Date: 02/23/16  Group Time: 1-2:30 pm  Participation Level: Active  Behavioral Response: Sharing  Type of Therapy: Process Group  Interventions: Biofeedback  Topic: Process: the first part of group was spent in process. Members shared about what they had done to support their sobriety since we last met. They also discussed any challenges or temptations they had also experienced. Two guests were present to observe group and they introduce themselves. A new group member was present and she was asked to disclose what had brought her here. The session ended with a 10-minute guided meditation.   Group Time: 2:30-4 pm  Participation Level: Active  Behavioral Response: Sharing  Type of Therapy: Psycho-education Group  Interventions: Strength-based  Topic: Psycho-Ed/Graduation: the second half of group was spent in a psycho-ed on Self-Care. A slide show was presented with emphasis on diet, sleep and exercise. Education, based on the most recent research, was provided on each of these topics and members discussed what their lifestyles have been in each instance. The psycho-ed ended early to allow for a graduation ceremony. Brownies were provided and the medallion was passed around with each member offering words of hope and gratitude to the graduating member.   Summary: The patient reported he had gone to dinner with his sponsor last night. They ad talked for about 2 hours. He had gone to bed early and then went to work. The patient reported he hadn't found the meditation very helpful, but noted that to relax here in this room is very diffiucty for him. He was attentive, but shared little off himself in the psycho-ed. The patient remains distant from the group as a whole and has maintained in previous sessions that alcohol has not caused the problems for him  that it has for the other group members. He is very shut down and we will have to invite him to open up and disclose his inner feelings, if he is even able or willing to do that. We will continue to follow closely in the days ahead.    UDS collected: No  Results:   AA/NA attended?: No  Sponsor?: Yes   Brandon Melnick, LCAS 03/30/2016 7:57 AM

## 2016-03-30 NOTE — Progress Notes (Signed)
    Daily Group Progress Note  Program: CD-IOP   03/30/2016 [name] [id number]  Diagnosis:  No diagnosis found.   Sobriety Date: 8/  Group Time: 1-2:30 pm  Participation Level: Active  Behavioral Response: Sharing  Type of Therapy: Process Group  Interventions: Strength-based  Topic: Process: Counselors met with patients to discuss activities they had engaged in to support their recovery. Several group members met with the program director to discuss medication management and to prepare for discharge. All members were active and engaged in the discussion concerning recovery from mind-altering drugs and alcohol.   Group Time: 2:30-4 pm  Participation Level: Active  Behavioral Response: Appropriate and Resistant  Type of Therapy: Psycho-education Group  Interventions: Solution Focused  Topic: Psychoeducation: Counselors met with patients to discuss relapse prevention skills and answer questions about 12 step programs. Patients were able to discuss what relapse prevention skills they used, and how successful they believed them to be. Counselors and group members provided constructive feedback regarding these skills. Drug tests were collected from most members.    Summary: Patient presented as active and engaged in the group. When asked how he was doing, the patient replied he had been "wonderful." The patient was able to spend time with his family and reported having a productive conversation with his wife regarding giving up drinking. The patient stated that he was grateful his cravings had not been bad but was encouraged by the counselor to use this time to prepare for when the cravings will be stronger. When confronted by a counselor regarding a hand gesture, the patient became defensive and raised his voice. He was unwilling to share what he had meant by this gesture and insisted that it meant nothing. The patient was able to attend 2 AA meetings. A drug test was  collected from the patient. He remains closed off to the group and, more likely, himself.     UDS collected: Yes Results: negative  AA/NA attended?: YesMonday and Tuesday  Sponsor?: Yes   Brandon Melnick, [place] 03/30/2016 10:33 AM

## 2016-03-31 ENCOUNTER — Encounter (HOSPITAL_COMMUNITY): Payer: Self-pay | Admitting: Psychology

## 2016-03-31 ENCOUNTER — Other Ambulatory Visit (HOSPITAL_COMMUNITY): Payer: 59 | Admitting: Psychology

## 2016-03-31 DIAGNOSIS — Z6372 Alcoholism and drug addiction in family: Secondary | ICD-10-CM

## 2016-03-31 DIAGNOSIS — F329 Major depressive disorder, single episode, unspecified: Secondary | ICD-10-CM

## 2016-03-31 DIAGNOSIS — F1020 Alcohol dependence, uncomplicated: Secondary | ICD-10-CM

## 2016-03-31 LAB — TOXASSURE SELECT,+ANTIDEPR,UR

## 2016-03-31 NOTE — Progress Notes (Signed)
Daily Group Progress Note  Program: CD-IOP   03/31/2016 [name] [id number]  Diagnosis:  No diagnosis found.   Sobriety Date: 02/23/16  Group Time: 1-2:30 pm  Participation Level: Active  Behavioral Response: Sharing  Type of Therapy: Process Group  Interventions: Supportive  Topic: Process: the first half of group was spent in process. Members shared about the past weekend and any activities they had engaged in to support or strengthen their recovery. They also shared about any challenges or temptations they may have experienced. A new group member was present and he introduced himself. The program director was here today and met with a group member. Random drug tests were collected.   Group Time: 2:30-4 pm  Participation Level: Minimal  Behavioral Response: withdrawn  Type of Therapy: Psycho-education Group  Interventions: Solution Focused  Topic: Psycho-Ed: the second half of group began with a 5-minute meditation. Group members provided valuable feedback about the music and this type of meditation. The topic for the psycho-ed was "Resentments". Members shared about why resentments are the #1 offender, as quoted in the [employer]. The benefits that resentments provide were identified and a handout on addressing one's resentments was distributed with plans to review in upcoming sessions.  Summary: The patient reported he and his wife had gone down to their beach house at [place]. She went with him on Friday, Saturday and Sunday mornings to the [facility] meeting in [place] at [place]. He noted that he picked up his 30-day chip. This brought a round of applause from the group. The patient reported his son had stayed home and he and his wife had good conversations and a nice weekend together. When asked about the meditation, the patient reported "I can't do it in this setting". He explained that when he is in a group of people he can't relax, but is  hypervigilant and certainly doesn't like to close his eyes for long. The patient reported he has begun to listen to classical music when he goes to bed and wears head phones. He finds this relaxing, but with others around he cannot let go enough and really relax. The patient shared little of himself in the psycho-ed. The patient sits in the same chair, which happens to be next to this counselor. He makes a lot of noise that he is likely unaware of, including burping, rustling papers and the wrappings of his granola bars. He even does this when others are sharing about intense feelings. It is unclear, at times, whether he is really paying attention or in his own world.    UDS collected: No Results:  AA/NA attended?: YesFriday, Saturday and Sunday  Sponsor?: Yes   Brandon Melnick, LCAS 03/31/2016 8:20 AM

## 2016-04-01 ENCOUNTER — Other Ambulatory Visit (HOSPITAL_COMMUNITY): Payer: 59 | Admitting: Psychology

## 2016-04-01 DIAGNOSIS — F1020 Alcohol dependence, uncomplicated: Secondary | ICD-10-CM | POA: Diagnosis not present

## 2016-04-01 DIAGNOSIS — F329 Major depressive disorder, single episode, unspecified: Secondary | ICD-10-CM

## 2016-04-03 ENCOUNTER — Encounter (HOSPITAL_COMMUNITY): Payer: Self-pay | Admitting: Psychology

## 2016-04-03 NOTE — Progress Notes (Signed)
    Daily Group Progress Note  Program: CD-IOP   04/03/2016 Alexander Ramsey [id number]  Diagnosis:  No diagnosis found.   Sobriety Date: 8/21  Group Time: 1-2:30 pm  Participation Level: Active  Behavioral Response: Sharing  Type of Therapy: Process Group  Interventions: Supportive  Topic: Process: Counselors met with patients to discuss activities they had engaged in to aid in their recovery. Several group members met with the program director for orientation activities and to discuss medication management. All members were active and engaged in discussion concerning recovery from mind-altering drugs and alcohol.   Group Time: 2:30-4 pm  Participation Level: Minimal  Behavioral Response: disinterested, bored  Type of Therapy: Psycho-education Group  Interventions: Strength-based  Topic: Psycho-education: A chaplain associated with [name] led the psychoeducation portion of the group. The session began with a mindfulness exercise, and the chaplain presented a CBT based flow chart regarding patient reactions to negative experiences. Counselors helped facilitate discussion among group members regarding this topic. Drug tests were collected from several group members. PHQ-9 assessments were administered to all group members.   Summary: Patient presented as moderately active and moderately engaged in the group. He reported seeing his sponsor on Monday. He stated that he had been "irritated" in the past few days. The patient had lost his wallet and important personal documents which had been incredibly frustrating. When asked if these life stressors had triggered thoughts of using, the patient reported that he was "too irritated to drink." While other group members were sharing, the patient appeared distracted and restless. He shared little of himself during the psycho-ed.The counselor administered PHQ-9 assessment to monitor patient's changes in mood and effectiveness of  medication.   UDS collected: No Results:  AA/NA attended?: No  Sponsor?: No   Brandon Melnick, LCAS 04/03/2016 1:22 PM

## 2016-04-05 ENCOUNTER — Encounter (HOSPITAL_COMMUNITY): Payer: Self-pay | Admitting: Psychology

## 2016-04-05 ENCOUNTER — Other Ambulatory Visit (HOSPITAL_COMMUNITY): Payer: 59 | Attending: Psychiatry | Admitting: Medical

## 2016-04-05 DIAGNOSIS — Z811 Family history of alcohol abuse and dependence: Secondary | ICD-10-CM | POA: Diagnosis not present

## 2016-04-05 DIAGNOSIS — F1020 Alcohol dependence, uncomplicated: Secondary | ICD-10-CM | POA: Insufficient documentation

## 2016-04-05 DIAGNOSIS — Z6372 Alcoholism and drug addiction in family: Secondary | ICD-10-CM

## 2016-04-05 DIAGNOSIS — F1994 Other psychoactive substance use, unspecified with psychoactive substance-induced mood disorder: Secondary | ICD-10-CM

## 2016-04-05 DIAGNOSIS — F329 Major depressive disorder, single episode, unspecified: Secondary | ICD-10-CM

## 2016-04-05 DIAGNOSIS — I10 Essential (primary) hypertension: Secondary | ICD-10-CM

## 2016-04-05 DIAGNOSIS — F19982 Other psychoactive substance use, unspecified with psychoactive substance-induced sleep disorder: Secondary | ICD-10-CM

## 2016-04-05 NOTE — Progress Notes (Signed)
    Daily Group Progress Note  Program: CD-IOP   04/05/2016 [name] [id number]  Diagnosis:  Alcohol use disorder, severe, dependence ([place])  Depression, reactive   Sobriety Date: 02/23/16  Group Time: 1-2:30  Participation Level: Active  Behavioral Response: Appropriate and Sharing  Type of Therapy: Process Group  Interventions: CBT and Solution Focused  Topic: Patients were active and engaged for group process session. Counselors encouraged patients to share openly about their successes and challenges in recovery from mind-altering drugs and alcohol. Patients took turns checking in and updating the group on recovery-related activities.     Group Time: 2:30-4  Participation Level: Active  Behavioral Response: Appropriate and Sharing  Type of Therapy: Psycho-education Group  Interventions: Other: spirituality psychoeducation  Topic: Patients were active and engaged for group psychoeducation session. Counselors facilitated a discussion on "spirituality and recovery". Patients discussed their personal faith stories, higher powers, and how prayer can help aid in recovery. Many patients reported that the session was helpful and "opened their eyes to other perspectives".    Summary: Patient presented as active and engaged in group. He reported that he is enjoying utilizing meditation and music therapy for his recovery in his personal time. Patient reports that he has yet to attend a local meeting regularly but he enjoys an [place] meeting at the beach he goes to 6 times/month. Patient displays knowledge and moderate commitment to sobriety and living a life of recovery. Patient stated that he will be attending a mandatory work-related training next week and will miss on Wednesday and Thursday. Patient has shown an ability to maintain early sobriety from alcohol through drug tests and self-report. He does not have a sponsor and does not show significant desire to get one soon.  Patient balances an active work schedule and CD-IOP but he continues to miss groups due to pre-scheduled work conflicts. Youlanda Roys, LPC-A   UDS collected: No Results: negative  AA/NA attended?: No  Sponsor?: No   Brandon Melnick, LCAS 04/05/2016 10:43 AM

## 2016-04-06 ENCOUNTER — Other Ambulatory Visit: Payer: Self-pay | Admitting: Family Medicine

## 2016-04-07 NOTE — Progress Notes (Signed)
  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 Pt was compliant with program requirements except for a series of absences scheduled thru work for various trainingsmaking it necessary to discharge him Goals and Activities to Help Maintain Sobriety: 1. Stay away from old friends who continue to drink and use mind-altering chemicals. 2. Continue practicing Fair Fighting rules in interpersonal conflicts. 3. Continue alcohol and drug refusal skills and call on support systems. 4.   Folowup with EAP  Referrals: NA   1. Aftercare services: Tuesdays [facility] OP 5:00 pm 2. Attend AA meetings 3-4 times per week. 3. Obtain a sponsor and a home group in [place] 4. Return to EAP   Next appointment: to be determined  Plan of Action to Address Continuing Problems: As above    Client has participated in the development of this discharge plan and has received a copy of this completed plan  Darlyne Russian  04/07/2016   Darlyne Russian, PA-C 04/07/2016

## 2016-04-12 ENCOUNTER — Encounter (HOSPITAL_COMMUNITY): Payer: Self-pay | Admitting: Medical

## 2016-04-27 ENCOUNTER — Encounter: Payer: Managed Care, Other (non HMO) | Admitting: Family Medicine

## 2016-05-24 ENCOUNTER — Other Ambulatory Visit: Payer: Self-pay

## 2016-05-25 ENCOUNTER — Ambulatory Visit (INDEPENDENT_AMBULATORY_CARE_PROVIDER_SITE_OTHER): Payer: Managed Care, Other (non HMO) | Admitting: Family Medicine

## 2016-05-25 ENCOUNTER — Encounter: Payer: Self-pay | Admitting: Family Medicine

## 2016-05-25 VITALS — BP 136/81 | Temp 98.8°F | Ht 73.5 in | Wt 302.0 lb

## 2016-05-25 DIAGNOSIS — I10 Essential (primary) hypertension: Secondary | ICD-10-CM | POA: Diagnosis not present

## 2016-05-25 DIAGNOSIS — G4730 Sleep apnea, unspecified: Secondary | ICD-10-CM | POA: Insufficient documentation

## 2016-05-25 DIAGNOSIS — Z0000 Encounter for general adult medical examination without abnormal findings: Secondary | ICD-10-CM | POA: Diagnosis not present

## 2016-05-25 DIAGNOSIS — F1020 Alcohol dependence, uncomplicated: Secondary | ICD-10-CM

## 2016-05-25 DIAGNOSIS — Z13220 Encounter for screening for lipoid disorders: Secondary | ICD-10-CM

## 2016-05-25 DIAGNOSIS — Z125 Encounter for screening for malignant neoplasm of prostate: Secondary | ICD-10-CM | POA: Diagnosis not present

## 2016-05-25 DIAGNOSIS — R6882 Decreased libido: Secondary | ICD-10-CM | POA: Diagnosis not present

## 2016-05-25 MED ORDER — CITALOPRAM HYDROBROMIDE 40 MG PO TABS: 40.0000 mg | ORAL_TABLET | Freq: Every day | ORAL | 12 refills | Status: DC

## 2016-05-25 MED ORDER — LOSARTAN POTASSIUM-HCTZ 50-12.5 MG PO TABS: 1.0000 | ORAL_TABLET | Freq: Every day | ORAL | 12 refills | Status: DC

## 2016-05-25 MED ORDER — ESOMEPRAZOLE MAGNESIUM 40 MG PO CPDR
40.0000 mg | DELAYED_RELEASE_CAPSULE | Freq: Every day | ORAL | 12 refills | Status: DC
Start: 2016-05-25 — End: 2017-08-15

## 2016-05-25 MED ORDER — METOPROLOL SUCCINATE ER 50 MG PO TB24: 50.0000 mg | ORAL_TABLET | Freq: Every day | ORAL | 12 refills | Status: DC

## 2016-05-25 NOTE — Assessment & Plan Note (Signed)
Stable and a little better

## 2016-05-25 NOTE — Assessment & Plan Note (Signed)
Discuss diet and wt loss

## 2016-05-25 NOTE — Assessment & Plan Note (Signed)
Even with no etoh decreased sex drive Will check testosterone

## 2016-05-25 NOTE — Assessment & Plan Note (Signed)
At the end of the useful life of his CPAP machine which is approximately 58 years older so. Needing new supplies and machine

## 2016-05-25 NOTE — Assessment & Plan Note (Signed)
The current medical regimen is effective;  continue present plan and medications.  

## 2016-05-25 NOTE — Progress Notes (Signed)
BP 136/81   Temp 98.8 F (37.1 C)   Ht 6' 1.5" (1.867 m)   Wt (!) 302 lb (137 kg) Comment: at home without gear  SpO2 98%   BMI 39.30 kg/m    Subjective:    Patient ID: Alexander Ramsey, male    DOB: 1966/04/26, 50 y.o.   MRN: [id number]  HPI: Alexander Ramsey is a 50 y.o. male  Chief Complaint  Patient presents with  . Annual Exam    needs lab order to have labs done thru employer  Pt follow-up all in all doing well blood pressure good control Nexium doing okay with reflux Depression doing okay has been able to cut back alcohol use by about half. Which is a major change. 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 Interim medical history since our last visit reviewed. Allergies and medications reviewed and updated.  Review of Systems  Constitutional: Negative.   HENT: Negative.   Eyes: Negative.   Respiratory: Negative.   Cardiovascular: Negative.   Gastrointestinal: Negative.   Endocrine: Negative.   Genitourinary: Negative.   Musculoskeletal: Negative.   Skin: Negative.   Allergic/Immunologic: Negative.   Neurological: Negative.   Hematological: Negative.   Psychiatric/Behavioral: Negative.     Per HPI unless specifically indicated above     Objective:    BP 136/81   Temp 98.8 F (37.1 C)   Ht 6' 1.5" (1.867 m)   Wt (!) 302 lb (137 kg) Comment: at home without gear  SpO2 98%   BMI 39.30 kg/m   Wt Readings from Last 3 Encounters:  05/25/16 (!) 302 lb (137 kg)  12/05/15 (!) 302 lb (137 kg)  11/25/15 300 lb (136.1 kg)    Physical Exam  Constitutional: He is oriented to person, place, and time. He appears well-developed and well-nourished.  HENT:  Head: Normocephalic and atraumatic.  Right Ear: External ear normal.  Left Ear: External ear normal.  Eyes: Conjunctivae and EOM are normal. Pupils are equal, round, and reactive to light.  Neck: Normal range of motion.  Neck supple.  Cardiovascular: Normal rate, regular rhythm, normal heart sounds and intact distal pulses.   Pulmonary/Chest: Effort normal and breath sounds normal.  Abdominal: Soft. Bowel sounds are normal. There is no splenomegaly or hepatomegaly.  Genitourinary: Rectum normal, prostate normal and penis normal.  Musculoskeletal: Normal range of motion.  Neurological: He is alert and oriented to person, place, and time. He has normal reflexes.  Skin: No rash noted. No erythema.  Psychiatric: He has a normal mood and affect. His behavior is normal. Judgment and thought content normal.    Results for orders placed or performed in visit on 12/05/15  Urinalysis, Routine w reflex microscopic (not at [facility])  Result Value Ref Range   Specific Gravity, UA 1.015 1.005 - 1.030   pH, UA 6.5 5.0 - 7.5   Color, UA Yellow Yellow   Appearance Ur Clear Clear   Leukocytes, UA Negative Negative   Protein, UA Negative Negative/Trace   Glucose, UA Negative Negative   Ketones, UA Negative Negative   RBC, UA Negative Negative   Bilirubin, UA Negative Negative   Urobilinogen, Ur 0.2 0.2 - 1.0 mg/dL   Nitrite, UA Negative Negative      Assessment & Plan:   Problem List Items Addressed This Visit      Cardiovascular and Mediastinum   Essential hypertension - Primary    The  current medical regimen is effective;  continue present plan and medications.       Relevant Medications   losartan-hydrochlorothiazide (HYZAAR) 50-12.5 MG tablet   metoprolol succinate (TOPROL-XL) 50 MG 24 hr tablet     Respiratory   Sleep apnea    At the end of the useful life of his CPAP machine which is approximately 90 years older so. Needing new supplies and machine        Other   Alcohol use disorder, severe, dependence (H[facility])    Stable and a little better      Relevant Medications   citalopram (CELEXA) 40 MG tablet   Severe obesity (BMI >= 40) (HCC)    Discuss diet and wt loss      Low libido    Even with  no etoh decreased sex drive Will check testosterone       Other Visit Diagnoses    Screening PSA (prostate specific antigen)       Lipid screening       Routine general medical examination at a health care facility           Follow up plan: Return for BMP,  Lipids, ALT, AST.

## 2016-05-29 ENCOUNTER — Other Ambulatory Visit (HOSPITAL_COMMUNITY): Payer: Self-pay | Admitting: Medical

## 2016-06-04 ENCOUNTER — Other Ambulatory Visit: Payer: Self-pay

## 2016-06-04 DIAGNOSIS — Z299 Encounter for prophylactic measures, unspecified: Secondary | ICD-10-CM

## 2016-06-04 NOTE — Progress Notes (Signed)
Patient came in to have blood drawn for testing per Dr. Crissman's orders. 

## 2016-06-05 LAB — CMP12+LP+TP+TSH+6AC+PSA+CBC…
A/G RATIO: 1.5 (ref 1.2–2.2)
ALT: 46 IU/L — AB (ref 0–44)
AST: 35 IU/L (ref 0–40)
Albumin: 4.3 g/dL (ref 3.5–5.5)
Alkaline Phosphatase: 76 IU/L (ref 39–117)
BASOS: 1 %
BUN/Creatinine Ratio: 14 (ref 9–20)
BUN: 16 mg/dL (ref 6–24)
Basophils Absolute: 0 10*3/uL (ref 0.0–0.2)
Bilirubin Total: 0.6 mg/dL (ref 0.0–1.2)
CALCIUM: 9.1 mg/dL (ref 8.7–10.2)
CHLORIDE: 96 mmol/L (ref 96–106)
CHOL/HDL RATIO: 4 ratio (ref 0.0–5.0)
CREATININE: 1.18 mg/dL (ref 0.76–1.27)
Cholesterol, Total: 163 mg/dL (ref 100–199)
EOS (ABSOLUTE): 0.2 10*3/uL (ref 0.0–0.4)
ESTIMATED CHD RISK: 0.7 times avg. (ref 0.0–1.0)
Eos: 3 %
Free Thyroxine Index: 1.3 (ref 1.2–4.9)
GFR, EST AFRICAN AMERICAN: 83 mL/min/{1.73_m2} (ref >59)
GFR, EST NON AFRICAN AMERICAN: 72 mL/min/{1.73_m2} (ref >59)
GGT: 30 IU/L (ref 0–65)
GLUCOSE: 95 mg/dL (ref 65–99)
Globulin, Total: 2.8 g/dL (ref 1.5–4.5)
HDL: 41 mg/dL (ref >39)
HEMATOCRIT: 46.2 % (ref 37.5–51.0)
Hemoglobin: 14.9 g/dL (ref 12.6–17.7)
IMMATURE GRANS (ABS): 0 10*3/uL (ref 0.0–0.1)
IRON: 69 ug/dL (ref 38–169)
Immature Granulocytes: 0 %
LDH: 219 IU/L (ref 121–224)
LDL Calculated: 90 mg/dL (ref 0–99)
LYMPHS: 23 %
Lymphocytes Absolute: 1.5 10*3/uL (ref 0.7–3.1)
MCH: 29.2 pg (ref 26.6–33.0)
MCHC: 32.3 g/dL (ref 31.5–35.7)
MCV: 91 fL (ref 79–97)
MONOS ABS: 0.5 10*3/uL (ref 0.1–0.9)
Monocytes: 8 %
NEUTROS ABS: 4.3 10*3/uL (ref 1.4–7.0)
Neutrophils: 65 %
PHOSPHORUS: 3.9 mg/dL (ref 2.5–4.5)
POTASSIUM: 4.6 mmol/L (ref 3.5–5.2)
Platelets: 206 10*3/uL (ref 150–379)
Prostate Specific Ag, Serum: 0.7 ng/mL (ref 0.0–4.0)
RBC: 5.1 x10E6/uL (ref 4.14–5.80)
RDW: 13.9 % (ref 12.3–15.4)
SODIUM: 138 mmol/L (ref 134–144)
T3 UPTAKE RATIO: 26 % (ref 24–39)
T4 TOTAL: 5 ug/dL (ref 4.5–12.0)
TOTAL PROTEIN: 7.1 g/dL (ref 6.0–8.5)
TSH: 2.7 u[IU]/mL (ref 0.450–4.500)
Triglycerides: 158 mg/dL — ABNORMAL HIGH (ref 0–149)
URIC ACID: 7.6 mg/dL (ref 3.7–8.6)
VLDL Cholesterol Cal: 32 mg/dL (ref 5–40)
WBC: 6.5 10*3/uL (ref 3.4–10.8)

## 2016-06-05 LAB — URINALYSIS, COMPLETE
Bilirubin, UA: NEGATIVE
GLUCOSE, UA: NEGATIVE
Ketones, UA: NEGATIVE
LEUKOCYTES UA: NEGATIVE
Nitrite, UA: NEGATIVE
PH UA: 5.5 (ref 5.0–7.5)
PROTEIN UA: NEGATIVE
RBC, UA: NEGATIVE
SPEC GRAV UA: 1.013 (ref 1.005–1.030)
Urobilinogen, Ur: 0.2 mg/dL (ref 0.2–1.0)

## 2016-06-05 LAB — TESTOSTERONE: Testosterone: 514 ng/dL (ref 264–916)

## 2016-06-05 LAB — MICROSCOPIC EXAMINATION: CASTS: NONE SEEN /LPF

## 2016-06-07 ENCOUNTER — Encounter: Payer: Self-pay | Admitting: Family Medicine

## 2016-07-15 ENCOUNTER — Encounter: Payer: Self-pay | Admitting: Physician Assistant

## 2016-07-15 ENCOUNTER — Ambulatory Visit: Payer: Self-pay | Admitting: Physician Assistant

## 2016-07-15 VITALS — BP 130/70 | HR 92 | Temp 98.5°F

## 2016-07-15 DIAGNOSIS — J069 Acute upper respiratory infection, unspecified: Secondary | ICD-10-CM

## 2016-07-15 MED ORDER — AZITHROMYCIN 250 MG PO TABS
ORAL_TABLET | ORAL | 0 refills | Status: DC
Start: 2016-07-15 — End: 2016-12-29

## 2016-07-15 NOTE — Progress Notes (Signed)
S: C/o runny nose and congestion for 3 weeks, no fever, chills, cp/sob, v/d; mucus was green this am but clear throughout the day, cough is sporadic, dry, hacking; states the cough is a little better but still has other sx  Using otc meds: robitussin  O: PE: perrl eomi, normocephalic, tms dull, nasal mucosa red and swollen, throat injected, neck supple no lymph, lungs c t a, cv rrr, neuro intact  A:  Acute uri   P: drink fluids, continue regular meds , use otc meds of choice, return if not improving in 5 days, return earlier if worsening , zpack

## 2016-07-20 ENCOUNTER — Ambulatory Visit: Payer: Self-pay | Admitting: Physician Assistant

## 2016-07-20 ENCOUNTER — Encounter: Payer: Self-pay | Admitting: Physician Assistant

## 2016-07-20 DIAGNOSIS — F1010 Alcohol abuse, uncomplicated: Secondary | ICD-10-CM

## 2016-07-20 NOTE — Addendum Note (Signed)
Addended by: Rudene Anda T on: 07/20/2016 03:20 PM   Modules accepted: Orders

## 2016-07-20 NOTE — Progress Notes (Signed)
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

## 2016-07-21 LAB — CBC WITH DIFFERENTIAL/PLATELET
BASOS ABS: 0 10*3/uL (ref 0.0–0.2)
Basos: 0 %
EOS (ABSOLUTE): 0 10*3/uL (ref 0.0–0.4)
Eos: 0 %
HEMOGLOBIN: 14.5 g/dL (ref 13.0–17.7)
Hematocrit: 42.6 % (ref 37.5–51.0)
IMMATURE GRANS (ABS): 0 10*3/uL (ref 0.0–0.1)
Immature Granulocytes: 0 %
LYMPHS: 15 %
Lymphocytes Absolute: 1.5 10*3/uL (ref 0.7–3.1)
MCH: 30.4 pg (ref 26.6–33.0)
MCHC: 34 g/dL (ref 31.5–35.7)
MCV: 89 fL (ref 79–97)
MONOCYTES: 5 %
Monocytes Absolute: 0.5 10*3/uL (ref 0.1–0.9)
NEUTROS PCT: 80 %
Neutrophils Absolute: 7.8 10*3/uL — ABNORMAL HIGH (ref 1.4–7.0)
PLATELETS: 204 10*3/uL (ref 150–379)
RBC: 4.77 x10E6/uL (ref 4.14–5.80)
RDW: 15.3 % (ref 12.3–15.4)
WBC: 9.8 10*3/uL (ref 3.4–10.8)

## 2016-07-21 LAB — COMPREHENSIVE METABOLIC PANEL
A/G RATIO: 1.6 (ref 1.2–2.2)
ALT: 41 IU/L (ref 0–44)
AST: 43 IU/L — AB (ref 0–40)
Albumin: 4.6 g/dL (ref 3.5–5.5)
Alkaline Phosphatase: 69 IU/L (ref 39–117)
BILIRUBIN TOTAL: 0.6 mg/dL (ref 0.0–1.2)
BUN/Creatinine Ratio: 11 (ref 9–20)
BUN: 11 mg/dL (ref 6–24)
CALCIUM: 9.2 mg/dL (ref 8.7–10.2)
CHLORIDE: 93 mmol/L — AB (ref 96–106)
CO2: 26 mmol/L (ref 18–29)
Creatinine, Ser: 1.02 mg/dL (ref 0.76–1.27)
GFR calc Af Amer: 99 mL/min/{1.73_m2} (ref >59)
GFR calc non Af Amer: 86 mL/min/{1.73_m2} (ref >59)
GLUCOSE: 75 mg/dL (ref 65–99)
Globulin, Total: 2.8 g/dL (ref 1.5–4.5)
POTASSIUM: 4 mmol/L (ref 3.5–5.2)
Sodium: 139 mmol/L (ref 134–144)
TOTAL PROTEIN: 7.4 g/dL (ref 6.0–8.5)

## 2016-07-21 LAB — ETHANOL: ETHANOL LVL: NEGATIVE %

## 2016-09-09 ENCOUNTER — Encounter: Payer: Self-pay | Admitting: Physician Assistant

## 2016-09-09 ENCOUNTER — Ambulatory Visit: Payer: Self-pay | Admitting: Physician Assistant

## 2016-09-09 VITALS — BP 160/80 | HR 88 | Temp 97.8°F

## 2016-09-09 DIAGNOSIS — J0100 Acute maxillary sinusitis, unspecified: Secondary | ICD-10-CM

## 2016-09-09 DIAGNOSIS — Z008 Encounter for other general examination: Secondary | ICD-10-CM

## 2016-09-09 DIAGNOSIS — Z0189 Encounter for other specified special examinations: Secondary | ICD-10-CM

## 2016-09-09 MED ORDER — AMOXICILLIN 875 MG PO TABS
875.0000 mg | ORAL_TABLET | Freq: Two times a day (BID) | ORAL | 0 refills | Status: DC
Start: 2016-09-09 — End: 2016-12-29

## 2016-09-09 MED ORDER — FLUTICASONE PROPIONATE 50 MCG/ACT NA SUSP: 2.0000 | Freq: Every day | NASAL | 6 refills | Status: DC

## 2016-09-09 NOTE — Progress Notes (Signed)
S: C/o runny nose and congestion for 2-3 months, states had uri and took zpack, got a little better but it never completely resolved,, no fever, chills, cp/sob, v/d; mucus is white and thick, cough is sporadic, c/o of facial and dental pain.   Using otc meds:   O: PE: vitals wnl, nad, perrl eomi, normocephalic, tms dull, nasal mucosa red and swollen, throat injected, neck supple no lymph, lungs c t a, cv rrr, neuro intact  A:  Acute sinusitis   P: drink fluids, continue regular meds , use otc meds of choice, return if not improving in 5 days, return earlier if worsening , amoxil 875mg  bid x 10d, flonase

## 2016-11-22 ENCOUNTER — Ambulatory Visit: Payer: Managed Care, Other (non HMO) | Admitting: Family Medicine

## 2016-12-20 ENCOUNTER — Other Ambulatory Visit: Payer: Self-pay | Admitting: Family Medicine

## 2016-12-21 ENCOUNTER — Telehealth: Payer: Self-pay | Admitting: Family Medicine

## 2016-12-21 NOTE — Telephone Encounter (Signed)
Patient needs a copy of his sleep study.  His wife has an appt this morning and he is hoping she can pick it up for him.  She is on his DPR.    If not he would like a call back. Thanks  [phone]

## 2016-12-21 NOTE — Telephone Encounter (Signed)
Looked in [place], did not see sleep study. Found one in [place] from 2008 and that's the only one I saw. Printed the report, gave them to [place] as patient's wife is seeing Dr. Wynetta Emery this morning.

## 2016-12-22 ENCOUNTER — Other Ambulatory Visit: Payer: Self-pay | Admitting: Physician Assistant

## 2016-12-22 MED ORDER — SILDENAFIL CITRATE 100 MG PO TABS: 100.0000 mg | ORAL_TABLET | Freq: Every day | ORAL | 6 refills | Status: DC | PRN

## 2016-12-22 NOTE — Progress Notes (Unsigned)
Pt called and would like a rx for viagra or cialis, has started dating since his separation and is having difficulty with ED.  No other issues, is not on nitroglycerin type products rx sent to pt's pharmacy of choice

## 2016-12-28 ENCOUNTER — Other Ambulatory Visit: Payer: Self-pay

## 2016-12-28 ENCOUNTER — Other Ambulatory Visit: Payer: Self-pay | Admitting: Family Medicine

## 2016-12-28 NOTE — Telephone Encounter (Signed)
Opened in error

## 2016-12-29 ENCOUNTER — Ambulatory Visit (INDEPENDENT_AMBULATORY_CARE_PROVIDER_SITE_OTHER): Payer: Managed Care, Other (non HMO) | Admitting: Family Medicine

## 2016-12-29 ENCOUNTER — Encounter: Payer: Self-pay | Admitting: Family Medicine

## 2016-12-29 VITALS — BP 120/85 | HR 86 | Ht 73.0 in | Wt 297.0 lb

## 2016-12-29 DIAGNOSIS — I10 Essential (primary) hypertension: Secondary | ICD-10-CM

## 2016-12-29 DIAGNOSIS — F329 Major depressive disorder, single episode, unspecified: Secondary | ICD-10-CM

## 2016-12-29 MED ORDER — CLONAZEPAM 1 MG PO TABS: 1.0000 mg | ORAL_TABLET | Freq: Two times a day (BID) | ORAL | 1 refills | Status: DC | PRN

## 2016-12-29 MED ORDER — HYDROCHLOROTHIAZIDE 12.5 MG PO TABS: 12.5000 mg | ORAL_TABLET | Freq: Every day | ORAL | 5 refills | Status: DC

## 2016-12-29 NOTE — Assessment & Plan Note (Signed)
Discussed depression anxiety will continue citalopram. We'll give clonazepam 1 mg to take a half to 1 when necessary severe anxiety.

## 2016-12-29 NOTE — Progress Notes (Signed)
BP 120/85   Pulse 86   Ht _0  (1.854 m)   Wt 297 lb (134.7 kg)   SpO2 95%   BMI 39.18 kg/m    Subjective:    Patient ID: Alexander Ramsey, male    DOB: April 15, 1966, 51 y.o.   MRN: [id number]  HPI: Alexander Ramsey is a 51 y.o. male  Chief Complaint  Patient presents with  . Follow-up  . Hypertension  Patient with episode of supraventricular tachycardia felt to be mostly secondary to stress anxiety and low potassium and magnesium which is been corrected. Patient has cardiology visit yesterday which was essentially normal and has scheduled stress test coming up soon. Due to stress appetite is been limited has been eating essentially unhealthy diet. Alcohol since episode in June his been greatly decreased maybe a couple drinks during the week is all. Patient also with intermittent cough. This cough and some sinus drainage has been going on pretty much all winter and into the spring and summer. Seems to clear up with very heavy coughing but that causes hoarseness which comes and goes. Patient has not felt sick at all.  Relevant past medical, surgical, family and social history reviewed and updated as indicated. Interim medical history since our last visit reviewed. Allergies and medications reviewed and updated.  Review of Systems  Constitutional: Negative.   Respiratory: Positive for cough. Negative for shortness of breath.   Cardiovascular: Negative.     Per HPI unless specifically indicated above     Objective:    BP 120/85   Pulse 86   Ht _1  (1.854 m)   Wt 297 lb (134.7 kg)   SpO2 95%   BMI 39.18 kg/m   Wt Readings from Last 3 Encounters:  12/29/16 297 lb (134.7 kg)  05/25/16 (!) 302 lb (137 kg)  12/05/15 (!) 302 lb (137 kg)    Physical Exam  Constitutional: He is oriented to person, place, and time. He appears well-developed and well-nourished.  HENT:  Head: Normocephalic and atraumatic.  Right Ear: External ear normal.  Left Ear: External ear normal.    Nose: Nose normal.  Mouth/Throat: Oropharynx is clear and moist.  Eyes: Conjunctivae and EOM are normal.  Neck: Normal range of motion.  Cardiovascular: Normal rate, regular rhythm and normal heart sounds.   Pulmonary/Chest: Effort normal and breath sounds normal.  Musculoskeletal: Normal range of motion.  Neurological: He is alert and oriented to person, place, and time.  Skin: No erythema.  Psychiatric: He has a normal mood and affect. His behavior is normal. Judgment and thought content normal.    Results for orders placed or performed in visit on 07/20/16  Ethanol  Result Value Ref Range   Ethanol Negative Cutoff=0.010 %  Comp Met (CMET)  Result Value Ref Range   Glucose 75 65 - 99 mg/dL   BUN 11 6 - 24 mg/dL   Creatinine, Ser 1.02 0.76 - 1.27 mg/dL   GFR calc non Af Amer 86 >59 mL/min/1.73   GFR calc Af Amer 99 >59 mL/min/1.73   BUN/Creatinine Ratio 11 9 - 20   Sodium 139 134 - 144 mmol/L   Potassium 4.0 3.5 - 5.2 mmol/L   Chloride 93 (L) 96 - 106 mmol/L   CO2 26 18 - 29 mmol/L   Calcium 9.2 8.7 - 10.2 mg/dL   Total Protein 7.4 6.0 - 8.5 g/dL   Albumin 4.6 3.5 - 5.5 g/dL   Globulin, Total 2.8 1.5 - 4.5 g/dL  Albumin/Globulin Ratio 1.6 1.2 - 2.2   Bilirubin Total 0.6 0.0 - 1.2 mg/dL   Alkaline Phosphatase 69 39 - 117 IU/L   AST 43 (H) 0 - 40 IU/L   ALT 41 0 - 44 IU/L  CBC with Differential  Result Value Ref Range   WBC 9.8 3.4 - 10.8 x10E3/uL   RBC 4.77 4.14 - 5.80 x10E6/uL   Hemoglobin 14.5 13.0 - 17.7 g/dL   Hematocrit 42.6 37.5 - 51.0 %   MCV 89 79 - 97 fL   MCH 30.4 26.6 - 33.0 pg   MCHC 34.0 31.5 - 35.7 g/dL   RDW 15.3 12.3 - 15.4 %   Platelets 204 150 - 379 x10E3/uL   Neutrophils 80 Not Estab. %   Lymphs 15 Not Estab. %   Monocytes 5 Not Estab. %   Eos 0 Not Estab. %   Basos 0 Not Estab. %   Neutrophils Absolute 7.8 (H) 1.4 - 7.0 x10E3/uL   Lymphocytes Absolute 1.5 0.7 - 3.1 x10E3/uL   Monocytes Absolute 0.5 0.1 - 0.9 x10E3/uL   EOS (ABSOLUTE) 0.0  0.0 - 0.4 x10E3/uL   Basophils Absolute 0.0 0.0 - 0.2 x10E3/uL   Immature Granulocytes 0 Not Estab. %   Immature Grans (Abs) 0.0 0.0 - 0.1 x10E3/uL      Assessment & Plan:   Problem List Items Addressed This Visit      Cardiovascular and Mediastinum   Essential hypertension - Primary    Hypertension good control. Cardiology increased metoprolol from 50 to100 mg patient hasn't taken increased dose yet. Losartan may be contributing to patient's chronic cough will discontinue losartan 50 mg Will continue hydrochlorothiazide 12.5 g Follow blood pressure      Relevant Medications   hydrochlorothiazide (HYDRODIURIL) 12.5 MG tablet   Other Relevant Orders   Basic metabolic panel   LP+ALT+AST Piccolo, Waived     Other   Depression, reactive    Discussed depression anxiety will continue citalopram. We'll give clonazepam 1 mg to take a half to 1 when necessary severe anxiety.      Relevant Medications   clonazePAM (KLONOPIN) 1 MG tablet       Follow up plan: Return in about 6 months (around 06/30/2017) for Physical Exam.

## 2016-12-29 NOTE — Assessment & Plan Note (Signed)
Hypertension good control. Cardiology increased metoprolol from 50 to100 mg patient hasn't taken increased dose yet. Losartan may be contributing to patient's chronic cough will discontinue losartan 50 mg Will continue hydrochlorothiazide 12.5 g Follow blood pressure

## 2017-01-07 ENCOUNTER — Other Ambulatory Visit: Payer: Self-pay

## 2017-01-11 ENCOUNTER — Other Ambulatory Visit: Payer: Self-pay

## 2017-01-11 DIAGNOSIS — Z299 Encounter for prophylactic measures, unspecified: Secondary | ICD-10-CM

## 2017-01-11 NOTE — Progress Notes (Signed)
Patient came in to have blood drawn for testing per Dr. Elta Guadeloupe Crissman's orders.

## 2017-01-12 LAB — SPECIMEN STATUS

## 2017-01-13 LAB — COMPREHENSIVE METABOLIC PANEL
ALT: 34 IU/L (ref 0–44)
AST: 31 IU/L (ref 0–40)
Albumin/Globulin Ratio: 1.5 (ref 1.2–2.2)
Albumin: 4 g/dL (ref 3.5–5.5)
Alkaline Phosphatase: 59 IU/L (ref 39–117)
BUN/Creatinine Ratio: 12 (ref 9–20)
BUN: 11 mg/dL (ref 6–24)
Bilirubin Total: 0.6 mg/dL (ref 0.0–1.2)
CALCIUM: 9.2 mg/dL (ref 8.7–10.2)
CO2: 24 mmol/L (ref 20–29)
Chloride: 102 mmol/L (ref 96–106)
Creatinine, Ser: 0.92 mg/dL (ref 0.76–1.27)
GFR, EST AFRICAN AMERICAN: 113 mL/min/{1.73_m2} (ref >59)
GFR, EST NON AFRICAN AMERICAN: 97 mL/min/{1.73_m2} (ref >59)
Globulin, Total: 2.7 g/dL (ref 1.5–4.5)
Glucose: 95 mg/dL (ref 65–99)
POTASSIUM: 3.9 mmol/L (ref 3.5–5.2)
Sodium: 142 mmol/L (ref 134–144)
TOTAL PROTEIN: 6.7 g/dL (ref 6.0–8.5)

## 2017-01-13 LAB — LIPID PANEL
CHOL/HDL RATIO: 2.5 ratio (ref 0.0–5.0)
Cholesterol, Total: 130 mg/dL (ref 100–199)
HDL: 52 mg/dL (ref >39)
LDL Calculated: 56 mg/dL (ref 0–99)
Triglycerides: 109 mg/dL (ref 0–149)
VLDL Cholesterol Cal: 22 mg/dL (ref 5–40)

## 2017-01-16 ENCOUNTER — Encounter: Payer: Self-pay | Admitting: Family Medicine

## 2017-01-25 ENCOUNTER — Other Ambulatory Visit: Payer: Self-pay | Admitting: Family Medicine

## 2017-01-26 LAB — CBC WITH DIFFERENTIAL/PLATELET
BASOS ABS: 0 10*3/uL (ref 0.0–0.2)
Basos: 0 %
EOS (ABSOLUTE): 0.1 10*3/uL (ref 0.0–0.4)
EOS: 1 %
HEMATOCRIT: 45.2 % (ref 37.5–51.0)
HEMOGLOBIN: 14.5 g/dL (ref 13.0–17.7)
IMMATURE GRANULOCYTES: 0 %
Immature Grans (Abs): 0 10*3/uL (ref 0.0–0.1)
LYMPHS ABS: 1.3 10*3/uL (ref 0.7–3.1)
Lymphs: 17 %
MCH: 32 pg (ref 26.6–33.0)
MCHC: 32.1 g/dL (ref 31.5–35.7)
MCV: 100 fL — ABNORMAL HIGH (ref 79–97)
MONOCYTES: 3 %
MONOS ABS: 0.3 10*3/uL (ref 0.1–0.9)
NEUTROS PCT: 79 %
Neutrophils Absolute: 5.8 10*3/uL (ref 1.4–7.0)
Platelets: 213 10*3/uL (ref 150–379)
RBC: 4.53 x10E6/uL (ref 4.14–5.80)
RDW: 14.9 % (ref 12.3–15.4)
WBC: 7.5 10*3/uL (ref 3.4–10.8)

## 2017-01-26 LAB — SPECIMEN STATUS REPORT

## 2017-02-19 ENCOUNTER — Other Ambulatory Visit: Payer: Self-pay | Admitting: Family Medicine

## 2017-02-21 NOTE — Telephone Encounter (Signed)
Last OV: 12/29/16 Next OV: 07/04/17  BMP Latest Ref Rng & Units 01/11/2017 07/20/2016 06/04/2016  Glucose 65 - 99 mg/dL 95 75 95  BUN 6 - 24 mg/dL 11 11 16   Creatinine 0.76 - 1.27 mg/dL 0.92 1.02 1.18  BUN/Creat Ratio 9 - 20 12 11 14   Sodium 134 - 144 mmol/L 142 139 138  Potassium 3.5 - 5.2 mmol/L 3.9 4.0 4.6  Chloride 96 - 106 mmol/L 102 93(L) 96  CO2 20 - 29 mmol/L 24 26 -  Calcium 8.7 - 10.2 mg/dL 9.2 9.2 9.1

## 2017-06-12 ENCOUNTER — Other Ambulatory Visit: Payer: Self-pay | Admitting: Family Medicine

## 2017-06-12 DIAGNOSIS — F1020 Alcohol dependence, uncomplicated: Secondary | ICD-10-CM

## 2017-06-29 ENCOUNTER — Other Ambulatory Visit: Payer: Self-pay | Admitting: Family Medicine

## 2017-06-29 DIAGNOSIS — I10 Essential (primary) hypertension: Secondary | ICD-10-CM

## 2017-07-04 ENCOUNTER — Encounter: Payer: Managed Care, Other (non HMO) | Admitting: Family Medicine

## 2017-07-25 ENCOUNTER — Ambulatory Visit: Payer: Self-pay | Admitting: Emergency Medicine

## 2017-07-25 VITALS — BP 140/85 | HR 92 | Temp 98.7°F | Resp 16

## 2017-07-25 DIAGNOSIS — J0100 Acute maxillary sinusitis, unspecified: Secondary | ICD-10-CM

## 2017-07-25 DIAGNOSIS — R059 Cough, unspecified: Secondary | ICD-10-CM

## 2017-07-25 DIAGNOSIS — R05 Cough: Secondary | ICD-10-CM

## 2017-07-25 MED ORDER — AMOXICILLIN 875 MG PO TABS: 875.0000 mg | ORAL_TABLET | Freq: Two times a day (BID) | ORAL | 0 refills | Status: DC

## 2017-07-25 NOTE — Progress Notes (Addendum)
Subjective. Patient became ill over 10 days ago. He started with a sore throat. He then developed a stuffy nose. Following this he developed significant chest and nasal congestion. At present he has hada cough productive of thick greenish yellow phlegm. He also has had nasal drainage with this color as well as postnasal drip. He has had difficulty sleeping at night due to his cough. He has a history of reflux for which he takes DEXA lab. Review of systems. History of depression currently on Celexa. History of reflux currently on dexilant. History of hypertension on HCTZ and Toprol. Objective. HEENT exam. TMs are clear. Nose is congested. Puffiness of both sinuses. Throat clear. Chest exam reveals occasional rhonchi no rales. Assessment. Patient seen with upper respiratory  infection now with sinusitis. Plan. Saline nasal spray. Flonase for nasal congestion. Amoxicillin 875 twice a day for 10 days.

## 2017-07-27 ENCOUNTER — Encounter: Payer: Managed Care, Other (non HMO) | Admitting: Family Medicine

## 2017-07-31 ENCOUNTER — Other Ambulatory Visit: Payer: Self-pay | Admitting: Family Medicine

## 2017-07-31 DIAGNOSIS — I10 Essential (primary) hypertension: Secondary | ICD-10-CM

## 2017-08-04 ENCOUNTER — Other Ambulatory Visit: Payer: Self-pay | Admitting: Family Medicine

## 2017-08-04 DIAGNOSIS — I10 Essential (primary) hypertension: Secondary | ICD-10-CM

## 2017-08-15 ENCOUNTER — Ambulatory Visit (INDEPENDENT_AMBULATORY_CARE_PROVIDER_SITE_OTHER): Payer: Managed Care, Other (non HMO) | Admitting: Family Medicine

## 2017-08-15 ENCOUNTER — Encounter: Payer: Self-pay | Admitting: Family Medicine

## 2017-08-15 DIAGNOSIS — F1020 Alcohol dependence, uncomplicated: Secondary | ICD-10-CM | POA: Diagnosis not present

## 2017-08-15 DIAGNOSIS — Z1329 Encounter for screening for other suspected endocrine disorder: Secondary | ICD-10-CM

## 2017-08-15 DIAGNOSIS — Z1211 Encounter for screening for malignant neoplasm of colon: Secondary | ICD-10-CM

## 2017-08-15 DIAGNOSIS — Z125 Encounter for screening for malignant neoplasm of prostate: Secondary | ICD-10-CM

## 2017-08-15 DIAGNOSIS — Z0001 Encounter for general adult medical examination with abnormal findings: Secondary | ICD-10-CM | POA: Diagnosis not present

## 2017-08-15 DIAGNOSIS — I10 Essential (primary) hypertension: Secondary | ICD-10-CM | POA: Insufficient documentation

## 2017-08-15 DIAGNOSIS — Z23 Encounter for immunization: Secondary | ICD-10-CM | POA: Diagnosis not present

## 2017-08-15 MED ORDER — CITALOPRAM HYDROBROMIDE 40 MG PO TABS: 40.0000 mg | ORAL_TABLET | Freq: Every day | ORAL | 4 refills | Status: DC

## 2017-08-15 MED ORDER — DEXILANT 60 MG PO CPDR: 60.0000 mg | DELAYED_RELEASE_CAPSULE | Freq: Every day | ORAL | 4 refills | Status: DC

## 2017-08-15 MED ORDER — METOPROLOL SUCCINATE ER 50 MG PO TB24: 50.0000 mg | ORAL_TABLET | Freq: Every day | ORAL | 4 refills | Status: DC

## 2017-08-15 MED ORDER — HYDROCHLOROTHIAZIDE 25 MG PO TABS: 25.0000 mg | ORAL_TABLET | Freq: Every day | ORAL | 4 refills | Status: DC

## 2017-08-15 NOTE — Progress Notes (Signed)
There were no vitals taken for this visit.   Subjective:    Patient ID: Alexander Ramsey, male    DOB: [birth year], 52 y.o.   MRN: [id number]  HPI: Alexander Ramsey is a 52 y.o. male  Annual exam: Patient all in all doing well blood pressure has been somewhat elevated has run out of hydrochlorothiazide 12.5 once a day.  Has noticed that blood pressures not doing as well on this dose as it was on losartan.  Cough has resolved being off of losartan. Reflux is been stable on Dexilant. Never did fill clonazepam. Taking citalopram 40 without problems and good control. Also taking metoprolol without problems.   Relevant past medical, surgical, family and social history reviewed and updated as indicated. Interim medical history since our last visit reviewed. Allergies and medications reviewed and updated.  Review of Systems  Constitutional: Negative.   HENT: Negative.   Eyes: Negative.   Respiratory: Negative.   Cardiovascular: Negative.   Gastrointestinal: Negative.   Endocrine: Negative.   Genitourinary: Negative.   Musculoskeletal: Negative.   Skin: Negative.   Allergic/Immunologic: Negative.   Neurological: Negative.   Hematological: Negative.   Psychiatric/Behavioral: Negative.     Per HPI unless specifically indicated above     Objective:    There were no vitals taken for this visit.  Wt Readings from Last 3 Encounters:  12/29/16 297 lb (134.7 kg)  05/25/16 (!) 302 lb (137 kg)  12/05/15 (!) 302 lb (137 kg)    Physical Exam  Constitutional: He is oriented to person, place, and time. He appears well-developed and well-nourished.  HENT:  Head: Normocephalic.  Right Ear: External ear normal.  Left Ear: External ear normal.  Nose: Nose normal.  Eyes: Conjunctivae and EOM are normal. Pupils are equal, round, and reactive to light.  Neck: Normal range of motion. Neck supple. No thyromegaly present.  Cardiovascular: Normal rate, regular rhythm, normal heart sounds  and intact distal pulses.  Pulmonary/Chest: Effort normal and breath sounds normal.  Abdominal: Soft. Bowel sounds are normal. There is no splenomegaly or hepatomegaly.  Genitourinary: Penis normal.  Genitourinary Comments: Deferred prostate exam to GI.  Musculoskeletal: Normal range of motion.  Lymphadenopathy:    He has no cervical adenopathy.  Neurological: He is alert and oriented to person, place, and time. He has normal reflexes.  Skin: Skin is warm and dry.  Psychiatric: He has a normal mood and affect. His behavior is normal. Judgment and thought content normal.    Results for orders placed or performed in visit on 01/11/17  Comp Met (CMET)  Result Value Ref Range   Glucose 95 65 - 99 mg/dL   BUN 11 6 - 24 mg/dL   Creatinine, Ser 0.92 0.76 - 1.27 mg/dL   GFR calc non Af Amer 97 >59 mL/min/1.73   GFR calc Af Amer 113 >59 mL/min/1.73   BUN/Creatinine Ratio 12 9 - 20   Sodium 142 134 - 144 mmol/L   Potassium 3.9 3.5 - 5.2 mmol/L   Chloride 102 96 - 106 mmol/L   CO2 24 20 - 29 mmol/L   Calcium 9.2 8.7 - 10.2 mg/dL   Total Protein 6.7 6.0 - 8.5 g/dL   Albumin 4.0 3.5 - 5.5 g/dL   Globulin, Total 2.7 1.5 - 4.5 g/dL   Albumin/Globulin Ratio 1.5 1.2 - 2.2   Bilirubin Total 0.6 0.0 - 1.2 mg/dL   Alkaline Phosphatase 59 39 - 117 IU/L   AST 31 0 - 40  IU/L   ALT 34 0 - 44 IU/L  Lipid Profile  Result Value Ref Range   Cholesterol, Total 130 100 - 199 mg/dL   Triglycerides 109 0 - 149 mg/dL   HDL 52 >39 mg/dL   VLDL Cholesterol Cal 22 5 - 40 mg/dL   LDL Calculated 56 0 - 99 mg/dL   Chol/HDL Ratio 2.5 0.0 - 5.0 ratio  Specimen Status  Result Value Ref Range   WBC WILL FOLLOW    RBC WILL FOLLOW    Hemoglobin WILL FOLLOW    Hematocrit WILL FOLLOW    MCV WILL FOLLOW    MCH WILL FOLLOW    MCHC WILL FOLLOW    RDW WILL FOLLOW    Platelets WILL FOLLOW    Neutrophils WILL FOLLOW    Lymphs WILL FOLLOW    Monocytes WILL FOLLOW    Eos WILL FOLLOW    Basos WILL FOLLOW     Neutrophils Absolute WILL FOLLOW    Lymphocytes Absolute WILL FOLLOW    Monocytes Absolute WILL FOLLOW    EOS (ABSOLUTE) WILL FOLLOW    Basophils Absolute WILL FOLLOW    Immature Granulocytes WILL FOLLOW    Immature Grans (Abs) WILL FOLLOW   CBC with Differential/Platelet  Result Value Ref Range   WBC 7.5 3.4 - 10.8 x10E3/uL   RBC 4.53 4.14 - 5.80 x10E6/uL   Hemoglobin 14.5 13.0 - 17.7 g/dL   Hematocrit 45.2 37.5 - 51.0 %   MCV 100 (H) 79 - 97 fL   MCH 32.0 26.6 - 33.0 pg   MCHC 32.1 31.5 - 35.7 g/dL   RDW 14.9 12.3 - 15.4 %   Platelets 213 150 - 379 x10E3/uL   Neutrophils 79 Not Estab. %   Lymphs 17 Not Estab. %   Monocytes 3 Not Estab. %   Eos 1 Not Estab. %   Basos 0 Not Estab. %   Neutrophils Absolute 5.8 1.4 - 7.0 x10E3/uL   Lymphocytes Absolute 1.3 0.7 - 3.1 x10E3/uL   Monocytes Absolute 0.3 0.1 - 0.9 x10E3/uL   EOS (ABSOLUTE) 0.1 0.0 - 0.4 x10E3/uL   Basophils Absolute 0.0 0.0 - 0.2 x10E3/uL   Immature Granulocytes 0 Not Estab. %   Immature Grans (Abs) 0.0 0.0 - 0.1 x10E3/uL  Specimen status report  Result Value Ref Range   specimen status report Comment       Assessment & Plan:   Problem List Items Addressed This Visit      Cardiovascular and Mediastinum   Essential hypertension - Primary   Relevant Medications   hydrochlorothiazide (HYDRODIURIL) 25 MG tablet   metoprolol succinate (TOPROL-XL) 50 MG 24 hr tablet     Other   Alcohol use disorder, severe, dependence (HCC)   Relevant Medications   citalopram (CELEXA) 40 MG tablet    Other Visit Diagnoses    Prostate cancer screening       Thyroid disorder screen       Colon cancer screening       Relevant Orders   Ambulatory referral to Gastroenterology   Needs flu shot       Relevant Orders   Flu Vaccine QUAD 36+ mos IM (Completed)       Follow up plan: Return in about 2 months (around 10/13/2017) for BMP.

## 2017-08-17 ENCOUNTER — Encounter: Payer: Self-pay | Admitting: *Deleted

## 2017-08-22 ENCOUNTER — Telehealth: Payer: Self-pay | Admitting: Gastroenterology

## 2017-08-22 NOTE — Telephone Encounter (Signed)
Pt called in to schedule his colonoscopy.

## 2017-08-24 ENCOUNTER — Telehealth: Payer: Self-pay

## 2017-08-24 ENCOUNTER — Other Ambulatory Visit: Payer: Self-pay

## 2017-08-24 DIAGNOSIS — Z1211 Encounter for screening for malignant neoplasm of colon: Secondary | ICD-10-CM

## 2017-08-24 NOTE — Telephone Encounter (Signed)
LVM returning call to pt to schedule colonoscopy.

## 2017-08-24 NOTE — Telephone Encounter (Signed)
Gastroenterology Pre-Procedure Review  Request Date: 10/04/17 Requesting Physician: Dr. Allen Norris  PATIENT REVIEW QUESTIONS: The patient responded to the following health history questions as indicated:    1. Are you having any GI issues? no 2. Do you have a personal history of Polyps? no 3. Do you have a family history of Colon Cancer or Polyps? no 4. Diabetes Mellitus? no 5. Joint replacements in the past 12 months?no 6. Major health problems in the past 3 months?no 7. Any artificial heart valves, MVP, or defibrillator?no    MEDICATIONS & ALLERGIES:    Patient reports the following regarding taking any anticoagulation/antiplatelet therapy:   Plavix, Coumadin, Eliquis, Xarelto, Lovenox, Pradaxa, Brilinta, or Effient? no Aspirin? no  Patient confirms/reports the following medications:  Current Outpatient Medications  Medication Sig Dispense Refill  . citalopram (CELEXA) 40 MG tablet Take 1 tablet (40 mg total) by mouth daily. 90 tablet 4  . DEXILANT 60 MG capsule Take 1 capsule (60 mg total) by mouth daily. 90 capsule 4  . hydrochlorothiazide (HYDRODIURIL) 25 MG tablet Take 1 tablet (25 mg total) by mouth daily. 90 tablet 4  . metoprolol succinate (TOPROL-XL) 50 MG 24 hr tablet Take 1 tablet (50 mg total) by mouth daily. Take with or immediately following a meal. 90 tablet 4   No current facility-administered medications for this visit.     Patient confirms/reports the following allergies:  Allergies  Allergen Reactions  . Losartan Potassium Cough    No orders of the defined types were placed in this encounter.   AUTHORIZATION INFORMATION Primary Insurance: 1D#: Group #:  Secondary Insurance: 1D#: Group #:  SCHEDULE INFORMATION: Date: 10/04/17 Time: Location:[facility]

## 2017-10-13 ENCOUNTER — Ambulatory Visit: Payer: Managed Care, Other (non HMO) | Admitting: Family Medicine

## 2017-10-25 ENCOUNTER — Ambulatory Visit: Payer: Managed Care, Other (non HMO) | Admitting: Anesthesiology

## 2017-10-25 ENCOUNTER — Encounter: Payer: Self-pay | Admitting: *Deleted

## 2017-10-25 ENCOUNTER — Encounter
Admission: RE | Disposition: A | Discharge status: Home / Self Care | Payer: Self-pay | Source: Ambulatory Visit | Attending: Gastroenterology

## 2017-10-25 ENCOUNTER — Ambulatory Visit
Admission: RE | Admit: 2017-10-25 | Discharge: 2017-10-25 | Disposition: A | Discharge status: Home / Self Care | Payer: Managed Care, Other (non HMO) | Source: Ambulatory Visit | Attending: Gastroenterology | Admitting: Gastroenterology

## 2017-10-25 DIAGNOSIS — I10 Essential (primary) hypertension: Secondary | ICD-10-CM | POA: Insufficient documentation

## 2017-10-25 DIAGNOSIS — D123 Benign neoplasm of transverse colon: Secondary | ICD-10-CM | POA: Diagnosis not present

## 2017-10-25 DIAGNOSIS — D122 Benign neoplasm of ascending colon: Secondary | ICD-10-CM | POA: Insufficient documentation

## 2017-10-25 DIAGNOSIS — Z1211 Encounter for screening for malignant neoplasm of colon: Secondary | ICD-10-CM | POA: Insufficient documentation

## 2017-10-25 DIAGNOSIS — Z79899 Other long term (current) drug therapy: Secondary | ICD-10-CM | POA: Diagnosis not present

## 2017-10-25 DIAGNOSIS — Z87891 Personal history of nicotine dependence: Secondary | ICD-10-CM | POA: Insufficient documentation

## 2017-10-25 HISTORY — PX: COLONOSCOPY WITH PROPOFOL: SHX5780

## 2017-10-25 SURGERY — COLONOSCOPY WITH PROPOFOL
Anesthesia: General

## 2017-10-25 MED ORDER — MIDAZOLAM HCL 2 MG/2ML IJ SOLN
INTRAMUSCULAR | Status: AC
  Filled 2017-10-25: qty 2

## 2017-10-25 MED ORDER — SODIUM CHLORIDE 0.9 % IV SOLN
INTRAVENOUS | Status: DC
  Administered 2017-10-25: 09:00:00 via INTRAVENOUS

## 2017-10-25 MED ORDER — PROPOFOL 500 MG/50ML IV EMUL
INTRAVENOUS | Status: AC
  Filled 2017-10-25: qty 50

## 2017-10-25 MED ORDER — PROPOFOL 500 MG/50ML IV EMUL
INTRAVENOUS | Status: DC | PRN
  Administered 2017-10-25: 75 ug/kg/min via INTRAVENOUS

## 2017-10-25 MED ORDER — SODIUM CHLORIDE 0.9 % IV SOLN
INTRAVENOUS | Status: DC
  Administered 2017-10-25: 1000 mL via INTRAVENOUS

## 2017-10-25 MED ORDER — MIDAZOLAM HCL 2 MG/2ML IJ SOLN
INTRAMUSCULAR | Status: DC | PRN
  Administered 2017-10-25: 2 mg via INTRAVENOUS

## 2017-10-25 MED ORDER — PROPOFOL 10 MG/ML IV BOLUS
INTRAVENOUS | Status: DC | PRN
  Administered 2017-10-25 (×6): 30 mg via INTRAVENOUS

## 2017-10-25 NOTE — Transfer of Care (Signed)
Immediate Anesthesia Transfer of Care Note  Patient: Alexander Ramsey  Procedure(s) Performed: COLONOSCOPY WITH PROPOFOL (N/A )  Patient Location: PACU  Anesthesia Type:General  Level of Consciousness: sedated  Airway & Oxygen Therapy: Patient Spontanous Breathing and Patient connected to nasal cannula oxygen  Post-op Assessment: Report given to RN and Post -op Vital signs reviewed and stable  Post vital signs: Reviewed and stable  Last Vitals:  Vitals Value Taken Time  BP 113/74 10/25/2017  9:02 AM  Temp 36.3 C 10/25/2017  9:00 AM  Pulse 84 10/25/2017  9:09 AM  Resp 14 10/25/2017  9:09 AM  SpO2 97 % 10/25/2017  9:09 AM  Vitals shown include unvalidated device data.  Last Pain:  Vitals:   10/25/17 0900  TempSrc: Tympanic  PainSc: 0-No pain         Complications: No apparent anesthesia complications

## 2017-10-25 NOTE — Anesthesia Preprocedure Evaluation (Signed)
Anesthesia Evaluation  Patient identified by MRN, date of birth, ID band Patient awake    Reviewed: Allergy & Precautions, NPO status , Patient's Chart, lab work & pertinent test results  History of Anesthesia Complications Negative for: history of anesthetic complications  Airway Mallampati: II  TM Distance: >3 FB Neck ROM: Full    Dental no notable dental hx.    Pulmonary sleep apnea and Continuous Positive Airway Pressure Ventilation , neg COPD, former smoker,    breath sounds clear to auscultation- rhonchi (-) wheezing      Cardiovascular hypertension, Pt. on medications (-) CAD, (-) Past MI, (-) Cardiac Stents and (-) CABG  Rhythm:Regular Rate:Normal - Systolic murmurs and - Diastolic murmurs    Neuro/Psych PSYCHIATRIC DISORDERS Depression negative neurological ROS     GI/Hepatic Neg liver ROS, GERD  ,  Endo/Other  negative endocrine ROSneg diabetes  Renal/GU negative Renal ROS     Musculoskeletal negative musculoskeletal ROS (+)   Abdominal (+) + obese,   Peds  Hematology negative hematology ROS (+)   Anesthesia Other Findings Past Medical History: No date: Diverticulosis No date: GERD (gastroesophageal reflux disease) No date: Hypertension No date: IBS (irritable bowel syndrome) No date: Sleep apnea   Reproductive/Obstetrics                             Anesthesia Physical Anesthesia Plan  ASA: II  Anesthesia Plan: General   Post-op Pain Management:    Induction: Intravenous  PONV Risk Score and Plan: 1 and Propofol infusion  Airway Management Planned: Natural Airway  Additional Equipment:   Intra-op Plan:   Post-operative Plan:   Informed Consent: I have reviewed the patients History and Physical, chart, labs and discussed the procedure including the risks, benefits and alternatives for the proposed anesthesia with the patient or authorized representative who has  indicated his/her understanding and acceptance.   Dental advisory given  Plan Discussed with: CRNA and Anesthesiologist  Anesthesia Plan Comments:         Anesthesia Quick Evaluation

## 2017-10-25 NOTE — H&P (Signed)
Lucilla Lame, MD [address] [place], [place] [address] Phone: [phone] Fax : [phone]  Primary Care Physician:  Guadalupe Maple, MD Primary Gastroenterologist:  Dr. Allen Norris  Pre-Procedure History & Physical: HPI:  Alexander Ramsey is a 52 y.o. male is here for a screening colonoscopy.   Past Medical History:  Diagnosis Date  . Diverticulosis   . GERD (gastroesophageal reflux disease)   . Hypertension   . IBS (irritable bowel syndrome)   . Sleep apnea     Past Surgical History:  Procedure Laterality Date  . COLONOSCOPY  2002 and 2004  . FOOT SURGERY Left Decemer 23, 2106   "Jones Fracture"    Prior to Admission medications   Medication Sig Start Date End Date Taking? Authorizing Provider  citalopram (CELEXA) 40 MG tablet Take 1 tablet (40 mg total) by mouth daily. 08/15/17  Yes Crissman, Jeannette How, MD  DEXILANT 60 MG capsule Take 1 capsule (60 mg total) by mouth daily. 08/15/17  Yes Crissman, Jeannette How, MD  hydrochlorothiazide (HYDRODIURIL) 25 MG tablet Take 1 tablet (25 mg total) by mouth daily. 08/15/17  Yes Crissman, Jeannette How, MD  metoprolol succinate (TOPROL-XL) 50 MG 24 hr tablet Take 1 tablet (50 mg total) by mouth daily. Take with or immediately following a meal. 08/15/17  Yes Guadalupe Maple, MD    Allergies as of 08/25/2017 - Review Complete 08/15/2017  Allergen Reaction Noted  . Losartan potassium Cough 08/15/2017    Family History  Problem Relation Age of Onset  . Hypertension Mother   . Sleep apnea Mother   . Cancer Father   . Emphysema Father   . Hypertension Sister   . Diabetes Sister   . Cancer Brother   . Hypertension Brother   . Sleep apnea Brother   . Stroke Paternal Grandmother   . Heart disease Neg Hx     Social History   Socioeconomic History  . Marital status: Legally Separated    Spouse name: Not on file  . Number of children: Not on file  . Years of education: Not on file  . Highest education level: Not on file    Occupational History  . Not on file  Social Needs  . Financial resource strain: Not on file  . Food insecurity:    Worry: Not on file    Inability: Not on file  . Transportation needs:    Medical: Not on file    Non-medical: Not on file  Tobacco Use  . Smoking status: Former Smoker    Last attempt to quit: 11/25/2003    Years since quitting: 13.9  . Smokeless tobacco: Current User    Types: Snuff  Substance and Sexual Activity  . Alcohol use: Yes    Alcohol/week: 0.0 oz    Comment: weekends  . Drug use: No  . Sexual activity: Not on file  Lifestyle  . Physical activity:    Days per week: Not on file    Minutes per session: Not on file  . Stress: Not on file  Relationships  . Social connections:    Talks on phone: Not on file    Gets together: Not on file    Attends religious service: Not on file    Active member of club or organization: Not on file    Attends meetings of clubs or organizations: Not on file    Relationship status: Not on file  . Intimate partner violence:    Fear of  current or ex partner: Not on file    Emotionally abused: Not on file    Physically abused: Not on file    Forced sexual activity: Not on file  Other Topics Concern  . Not on file  Social History Narrative  . Not on file    Review of Systems: See HPI, otherwise negative ROS  Physical Exam: BP (!) 149/103   Pulse (!) 107   Temp (!) 97.3 F (36.3 C) (Tympanic)   Resp 20   Ht 6\' 2"  (1.88 m)   Wt 280 lb (127 kg)   SpO2 98%   BMI 35.95 kg/m  General:   Alert,  pleasant and cooperative in NAD Head:  Normocephalic and atraumatic. Neck:  Supple; no masses or thyromegaly. Lungs:  Clear throughout to auscultation.    Heart:  Regular rate and rhythm. Abdomen:  Soft, nontender and nondistended. Normal bowel sounds, without guarding, and without rebound.   Neurologic:  Alert and  oriented x4;  grossly normal neurologically.  Impression/Plan: Alexander Ramsey is now here to undergo a  screening colonoscopy.  Risks, benefits, and alternatives regarding colonoscopy have been reviewed with the patient.  Questions have been answered.  All parties agreeable.

## 2017-10-25 NOTE — Op Note (Signed)
[facility] Gastroenterology Patient Name: Alexander Ramsey Procedure Date: 10/25/2017 8:32 AM MRN: [id number] Account #: [id number] Date of Birth: 1966/02/24 Admit Type: Outpatient Age: 52 Room: [facility] ENDO ROOM 4 Gender: Male Note Status: Finalized Procedure:            Colonoscopy Indications:          Screening for colorectal malignant neoplasm Providers:            Lucilla Lame MD, MD Referring MD:         Guadalupe Maple, MD (Referring MD) Medicines:            Propofol per Anesthesia Complications:        No immediate complications. Procedure:            Pre-Anesthesia Assessment:                       - Prior to the procedure, a History and Physical was                        performed, and patient medications and allergies were                        reviewed. The patient's tolerance of previous                        anesthesia was also reviewed. The risks and benefits of                        the procedure and the sedation options and risks were                        discussed with the patient. All questions were                        answered, and informed consent was obtained. Prior                        Anticoagulants: The patient has taken no previous                        anticoagulant or antiplatelet agents. ASA Grade                        Assessment: II - A patient with mild systemic disease.                        After reviewing the risks and benefits, the patient was                        deemed in satisfactory condition to undergo the                        procedure.                       After obtaining informed consent, the colonoscope was                        passed under direct vision. Throughout the procedure,  the patient's blood pressure, pulse, and oxygen                        saturations were monitored continuously. The                        Colonoscope was introduced through the anus and            advanced to the the cecum, identified by appendiceal                        orifice and ileocecal valve. The colonoscopy was                        performed without difficulty. The patient tolerated the                        procedure well. The quality of the bowel preparation                        was excellent. Findings:      The perianal and digital rectal examinations were normal.      A 5 mm polyp was found in the ascending colon. The polyp was sessile.       The polyp was removed with a cold snare. Resection and retrieval were       complete.      Two sessile polyps were found in the transverse colon. The polyps were 4       to 6 mm in size. These polyps were removed with a cold snare. Resection       and retrieval were complete. Impression:           - One 5 mm polyp in the ascending colon, removed with a                        cold snare. Resected and retrieved.                       - Two 4 to 6 mm polyps in the transverse colon, removed                        with a cold snare. Resected and retrieved. Recommendation:       - Discharge patient to home.                       - Resume previous diet.                       - Continue present medications.                       - Await pathology results.                       - Repeat colonoscopy in 5 years if polyp adenoma and 10                        years if hyperplastic Procedure Code(s):    --- Professional ---  45385, Colonoscopy, flexible; with removal of tumor(s),                        polyp(s), or other lesion(s) by snare technique Diagnosis Code(s):    --- Professional ---                       Z12.11, Encounter for screening for malignant neoplasm                        of colon                       D12.2, Benign neoplasm of ascending colon                       D12.3, Benign neoplasm of transverse colon (hepatic                        flexure or splenic flexure) CPT copyright 2017  American Medical Association. All rights reserved. The codes documented in this report are preliminary and upon coder review may  be revised to meet current compliance requirements. Lucilla Lame MD, MD 10/25/2017 8:59:06 AM This report has been signed electronically. Number of Addenda: 0 Note Initiated On: 10/25/2017 8:32 AM Scope Withdrawal Time: 0 hours 9 minutes 39 seconds  Total Procedure Duration: 0 hours 12 minutes 21 seconds       [facility]

## 2017-10-25 NOTE — Anesthesia Postprocedure Evaluation (Signed)
Anesthesia Post Note  Patient: Alexander Ramsey  Procedure(s) Performed: COLONOSCOPY WITH PROPOFOL (N/A )  Patient location during evaluation: Endoscopy Anesthesia Type: General Level of consciousness: awake and alert and oriented Pain management: pain level controlled Vital Signs Assessment: post-procedure vital signs reviewed and stable Respiratory status: spontaneous breathing, nonlabored ventilation and respiratory function stable Cardiovascular status: blood pressure returned to baseline and stable Postop Assessment: no signs of nausea or vomiting Anesthetic complications: no     Last Vitals:  Vitals:   10/25/17 0920 10/25/17 0930  BP: 126/75 134/83  Pulse:    Resp:  18  Temp:    SpO2:      Last Pain:  Vitals:   10/25/17 0930  TempSrc:   PainSc: 0-No pain                 Lawrie Tunks

## 2017-10-25 NOTE — Anesthesia Post-op Follow-up Note (Signed)
Anesthesia QCDR form completed.        

## 2017-10-26 ENCOUNTER — Encounter: Payer: Self-pay | Admitting: Gastroenterology

## 2017-10-26 LAB — SURGICAL PATHOLOGY

## 2017-10-27 ENCOUNTER — Encounter: Payer: Self-pay | Admitting: Gastroenterology

## 2017-11-08 ENCOUNTER — Encounter: Payer: Self-pay | Admitting: Family Medicine

## 2017-11-08 ENCOUNTER — Ambulatory Visit: Payer: Managed Care, Other (non HMO) | Admitting: Family Medicine

## 2017-11-08 ENCOUNTER — Other Ambulatory Visit: Payer: Self-pay | Admitting: Family Medicine

## 2017-11-08 VITALS — BP 141/90 | HR 74 | Ht 73.0 in | Wt 281.0 lb

## 2017-11-08 DIAGNOSIS — I10 Essential (primary) hypertension: Secondary | ICD-10-CM

## 2017-11-08 DIAGNOSIS — F1020 Alcohol dependence, uncomplicated: Secondary | ICD-10-CM

## 2017-11-08 MED ORDER — VORTIOXETINE HBR 10 MG PO TABS
10.0000 mg | ORAL_TABLET | Freq: Every day | ORAL | 3 refills | Status: DC
Start: 2017-11-08 — End: 2018-09-14

## 2017-11-08 MED ORDER — METOPROLOL SUCCINATE ER 100 MG PO TB24: 100.0000 mg | ORAL_TABLET | Freq: Every day | ORAL | 2 refills | Status: DC

## 2017-11-08 NOTE — Progress Notes (Signed)
BP (!) 141/90   Pulse 74   Ht 6\' 1"  (1.854 m)   Wt 281 lb (127.5 kg)   SpO2 98%   BMI 37.07 kg/m    Subjective:    Patient ID: Alexander Ramsey, male    DOB: May 13, 1966, 52 y.o.   MRN: [id number]  HPI: Alexander Ramsey is a 52 y.o. male  Chief Complaint  Patient presents with  . Follow-up  . Hypertension  Patient follow-up hypertension all in all doing well having a great deal of stress with moving and reconstruction and painting etc.  Also working at [employer] part-time Taking hydrochlorothiazide 25 mg without problems.  Has been taking metoprolol 50 mg a day.  Relates cardiologist had increase medication to 100 mg and blood pressure on 50 mg is been slightly elevated.  Has not been having tachycardia spells. Patient also relates grinding his teeth especially during the day but some at night. This been ongoing for years may be related to citalopram.  Somewhat getting worse has worked with his dentist and has cracked teeth and crowns which are getting very expensive. Dexilant doing okay for reflux.  Relevant past medical, surgical, family and social history reviewed and updated as indicated. Interim medical history since our last visit reviewed. Allergies and medications reviewed and updated.  Review of Systems  Per HPI unless specifically indicated above     Objective:    BP (!) 141/90   Pulse 74   Ht 6\' 1"  (1.854 m)   Wt 281 lb (127.5 kg)   SpO2 98%   BMI 37.07 kg/m   Wt Readings from Last 3 Encounters:  11/08/17 281 lb (127.5 kg)  10/25/17 280 lb (127 kg)  12/29/16 297 lb (134.7 kg)    Physical Exam  Results for orders placed or performed during the hospital encounter of 10/25/17  Surgical pathology  Result Value Ref Range   SURGICAL PATHOLOGY      Surgical Pathology CASE: [id number] PATIENT: Alexander Ramsey Surgical Pathology Report     SPECIMEN SUBMITTED: A. Colon polyp, ascending; cold snare B. Colon polyp x2, transverse; cold snare  CLINICAL  HISTORY: None provided  PRE-OPERATIVE DIAGNOSIS: Screening colonoscopy  POST-OPERATIVE DIAGNOSIS: Polyps     DIAGNOSIS: A. COLON POLYP, ASCENDING; COLD SNARE: - TUBULAR ADENOMA. - NEGATIVE FOR HIGH-GRADE DYSPLASIA AND MALIGNANCY.  B.  COLON POLYP X2, TRANSVERSE; COLD SNARE: - TUBULAR ADENOMA (2). - NEGATIVE FOR HIGH-GRADE DYSPLASIA AND MALIGNANCY.   GROSS DESCRIPTION: A. Labeled: Cold snare ascending colon polyp Received: In formalin Tissue fragment(s): Multiple Size: Aggregate, 0.9 x 0.3 x 0.1 cm Description: Tan fragments and yellow fecal material Entirely submitted in one cassette.  B. Labeled: Cold snare transverse colon polyp x2 Received: In formalin Tissue fragment(s): Multiple Size: Aggregate, 0.8 x 0.3 x 0.1 cm Description: Tan fragm ents and yellow fecal material Entirely submitted in one cassette.    Final Diagnosis performed by Quay Burow, MD.   Electronically signed 10/26/2017 12:33:27PM The electronic signature indicates that the named Attending Pathologist has evaluated the specimen  Technical component performed at [facility], [address], [place], [place] [address] Lab: [phone] Dir: Rush Farmer, MD, MMM  Professional component performed at [facility], [facility], [place], [place], [place] [address] Lab: [phone] Dir: Dellia Nims. Reuel Derby, MD       Assessment & Plan:   Problem List Items Addressed This Visit      Cardiovascular and Mediastinum   Essential hypertension - Primary   Relevant Orders  Basic metabolic panel       Follow up plan: No follow-ups on file.

## 2017-11-09 ENCOUNTER — Other Ambulatory Visit: Payer: Self-pay

## 2017-11-09 DIAGNOSIS — I10 Essential (primary) hypertension: Secondary | ICD-10-CM

## 2017-11-10 ENCOUNTER — Encounter: Payer: Self-pay | Admitting: Family Medicine

## 2017-11-10 LAB — BASIC METABOLIC PANEL
BUN/Creatinine Ratio: 11 (ref 9–20)
BUN: 11 mg/dL (ref 6–24)
CO2: 28 mmol/L (ref 20–29)
CREATININE: 0.97 mg/dL (ref 0.76–1.27)
Calcium: 9.1 mg/dL (ref 8.7–10.2)
Chloride: 97 mmol/L (ref 96–106)
GFR, EST AFRICAN AMERICAN: 105 mL/min/{1.73_m2} (ref >59)
GFR, EST NON AFRICAN AMERICAN: 91 mL/min/{1.73_m2} (ref >59)
Glucose: 95 mg/dL (ref 65–99)
POTASSIUM: 4.5 mmol/L (ref 3.5–5.2)
Sodium: 139 mmol/L (ref 134–144)

## 2018-01-09 ENCOUNTER — Encounter: Payer: Self-pay | Admitting: Family Medicine

## 2018-01-09 ENCOUNTER — Ambulatory Visit (INDEPENDENT_AMBULATORY_CARE_PROVIDER_SITE_OTHER): Payer: Managed Care, Other (non HMO) | Admitting: Family Medicine

## 2018-01-09 VITALS — BP 136/70 | HR 97 | Wt 276.0 lb

## 2018-01-09 DIAGNOSIS — I10 Essential (primary) hypertension: Secondary | ICD-10-CM

## 2018-01-09 DIAGNOSIS — G4730 Sleep apnea, unspecified: Secondary | ICD-10-CM | POA: Diagnosis not present

## 2018-01-09 NOTE — Progress Notes (Signed)
BP 136/70 (BP Location: Left Arm)   Pulse 97   Wt 276 lb (125.2 kg)   SpO2 94%   BMI 36.41 kg/m    Subjective:    Patient ID: Alexander Ramsey, male    DOB: 24-Aug-1965, 52 y.o.   MRN: [id number]  HPI: Alexander Ramsey is a 52 y.o. male  Chief Complaint  Patient presents with  . Follow-up  . Hypertension  Patient also for sleep evaluation.  Patient using CPAP machine which is 44+ years old which does not have a reportable card. 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  Patient currently with his machine unable to do compliance reporting. Patient has had a good response over the last 10+ years to his CPAP with resolution of daytime drowsiness symptoms.  Patient's had good compliance with his machine using it faithfully. Patient uses a full face mask uses regular filters humidifier chamber standard Pap tubing. Original diagnostic sleep study with follow-up with electronic signatures were reviewed. Unable to get office visit notes from 10+ years ago. Again patient uses his Pap equipment faithfully and has a marked benefit from using his CPAP therapy. Patient has a setting of 6 cm of water but due to weight changes may very for his needs.  Will recommend auto CPAP on his prescription.  Relevant past medical, surgical, family and social history reviewed and updated as indicated. Interim medical history since our last visit reviewed. Allergies and medications reviewed and updated.  Review of Systems  Constitutional: Negative.   Respiratory: Negative.   Cardiovascular: Negative.     Per HPI unless specifically indicated above     Objective:    BP 136/70 (BP Location: Left Arm)   Pulse 97   Wt 276 lb (125.2 kg)   SpO2 94%   BMI 36.41 kg/m   Wt Readings from Last 3 Encounters:  01/09/18 276 lb (125.2 kg)  11/08/17 281 lb (127.5 kg)  10/25/17 280 lb (127 kg)    Physical Exam    Constitutional: He is oriented to person, place, and time. He appears well-developed and well-nourished.  HENT:  Head: Normocephalic and atraumatic.  Eyes: Conjunctivae and EOM are normal.  Neck: Normal range of motion.  Cardiovascular: Normal rate, regular rhythm and normal heart sounds.  Pulmonary/Chest: Effort normal and breath sounds normal.  Musculoskeletal: Normal range of motion.  Neurological: He is alert and oriented to person, place, and time.  Skin: No erythema.  Psychiatric: He has a normal mood and affect. His behavior is normal. Judgment and thought content normal.    Results for orders placed or performed in visit on 36/14/43  Basic Metabolic Panel (BMET)  Result Value Ref Range   Glucose 95 65 - 99 mg/dL   BUN 11 6 - 24 mg/dL   Creatinine, Ser 0.97 0.76 - 1.27 mg/dL   GFR calc non Af Amer 91 >59 mL/min/1.73   GFR calc Af Amer 105 >59 mL/min/1.73   BUN/Creatinine Ratio 11 9 - 20   Sodium 139 134 - 144 mmol/L   Potassium 4.5 3.5 - 5.2 mmol/L   Chloride 97 96 - 106 mmol/L   CO2 28 20 - 29 mmol/L   Calcium 9.1 8.7 - 10.2 mg/dL      Assessment & Plan:   Problem List Items Addressed This Visit      Cardiovascular and Mediastinum   Essential hypertension - Primary  The current medical regimen is effective;  continue present plan and medications.       Relevant Orders   Basic metabolic panel     Respiratory   Sleep apnea    Patient with regular use of old CPAP machine needing new machine. We will give a prescription for auto CPAP with facemask standard tubing and filters and humidifying chamber.          Follow up plan: Follow-up 5 weeks after receiving new machine will be able to document usage at that point.

## 2018-01-09 NOTE — Assessment & Plan Note (Signed)
Patient with regular use of old CPAP machine needing new machine. We will give a prescription for auto CPAP with facemask standard tubing and filters and humidifying chamber.

## 2018-01-09 NOTE — Assessment & Plan Note (Signed)
The current medical regimen is effective;  continue present plan and medications.  

## 2018-01-10 ENCOUNTER — Encounter: Payer: Self-pay | Admitting: Family Medicine

## 2018-01-10 LAB — BASIC METABOLIC PANEL
BUN/Creatinine Ratio: 11 (ref 9–20)
BUN: 12 mg/dL (ref 6–24)
CO2: 25 mmol/L (ref 20–29)
Calcium: 9.9 mg/dL (ref 8.7–10.2)
Chloride: 97 mmol/L (ref 96–106)
Creatinine, Ser: 1.06 mg/dL (ref 0.76–1.27)
GFR calc Af Amer: 94 mL/min/{1.73_m2} (ref >59)
GFR calc non Af Amer: 81 mL/min/{1.73_m2} (ref >59)
Glucose: 84 mg/dL (ref 65–99)
Potassium: 3.6 mmol/L (ref 3.5–5.2)
Sodium: 142 mmol/L (ref 134–144)

## 2018-03-06 ENCOUNTER — Other Ambulatory Visit: Payer: Self-pay | Admitting: Family Medicine

## 2018-03-06 DIAGNOSIS — I10 Essential (primary) hypertension: Secondary | ICD-10-CM

## 2018-04-27 ENCOUNTER — Telehealth: Payer: Self-pay

## 2018-04-27 NOTE — Telephone Encounter (Signed)
Scheduled for Monday from 9 a.m - 1 p.m. They will call us during that block of time. Provider is aware.

## 2018-04-27 NOTE — Telephone Encounter (Signed)
Received fax from [employer] that APAP w/ compliance monitoring has been denied. Fax states, " Based on the information provided, this request cannot be approved. Your ordering provider may request a peer to peer conversation with [facility] Sleep management Program Medical Director by calling [phone]

## 2018-04-27 NOTE — Telephone Encounter (Signed)
Call [employer]

## 2018-05-02 ENCOUNTER — Telehealth: Payer: Self-pay

## 2018-05-02 NOTE — Telephone Encounter (Signed)
-----   Message from Amada Kingfisher, [place] sent at 05/01/2018  5:07 PM EDT ----- Regarding: FW: call re cpap   ----- Message ----- From: Guadalupe Maple, MD Sent: 05/01/2018  11:05 AM EDT To: [facility] Subject: call re cpap

## 2018-05-02 NOTE — Telephone Encounter (Signed)
Pt called back and advised Dr Jeananne Rama has left the office for today. Pt verbalized understanding and will call back in the am if he does not get a call back from you first.

## 2018-05-02 NOTE — Telephone Encounter (Signed)
Called and left patient a VM asking for him to please return my call.  

## 2018-05-03 NOTE — Telephone Encounter (Signed)
Discussed CPAP and insurance requirements the patient is not meeting.  Had conversation with patient's insurance provider and a peer to peer conversation they are giving the patient another 1 month to uses CPAP and if not insurance will not cover.  Discussed also DOT requirements patient will use faithfully and 6 hours plus the night.

## 2018-05-03 NOTE — Telephone Encounter (Signed)
Phone call

## 2018-05-31 ENCOUNTER — Other Ambulatory Visit: Payer: Self-pay | Admitting: Family Medicine

## 2018-05-31 DIAGNOSIS — I10 Essential (primary) hypertension: Secondary | ICD-10-CM

## 2018-05-31 NOTE — Telephone Encounter (Signed)
Requested Prescriptions  Pending Prescriptions Disp Refills  . metoprolol succinate (TOPROL-XL) 100 MG 24 hr tablet [Pharmacy Med Name: METOPROLOL SUCC ER 100 MG TAB] 90 tablet 0    Sig: TAKE 1 TABLET (100 MG TOTAL) BY MOUTH DAILY. TAKE WITH OR IMMEDIATELY FOLLOWING A MEAL.     Cardiovascular:  Beta Blockers Passed - 05/31/2018  2:34 AM      Passed - Last BP in normal range    BP Readings from Last 1 Encounters:  01/09/18 136/70         Passed - Last Heart Rate in normal range    Pulse Readings from Last 1 Encounters:  01/09/18 97         Passed - Valid encounter within last 6 months    Recent Outpatient Visits          4 months ago Essential hypertension   [facility], Jeannette How, MD   6 months ago Essential hypertension   [facility], Jeannette How, MD   9 months ago Essential hypertension   [place] [facility], Jeannette How, MD   1 year ago Essential hypertension   [place], Jeannette How, MD   2 years ago Essential hypertension   [facility], Jeannette How, MD

## 2018-08-31 ENCOUNTER — Other Ambulatory Visit: Payer: Self-pay | Admitting: Family Medicine

## 2018-08-31 DIAGNOSIS — I10 Essential (primary) hypertension: Secondary | ICD-10-CM

## 2018-08-31 NOTE — Telephone Encounter (Signed)
Requested medication (s) are due for refill today:  yes  Requested medication (s) are on the active medication list:  yes  Future visit scheduled:  no  Last Refill: 05/31/18 *pt. seen in 01/2018; was to f/u 5 weeks after getting CPAP started;  Called pt. and left vm that he needs to schedule f/u appt.    Requested Prescriptions  Pending Prescriptions Disp Refills   metoprolol succinate (TOPROL-XL) 100 MG 24 hr tablet [Pharmacy Med Name: METOPROLOL SUCC ER 100 MG TAB] 90 tablet 0    Sig: TAKE 1 TABLET (100 MG TOTAL) BY MOUTH DAILY. TAKE WITH OR IMMEDIATELY FOLLOWING A MEAL.     Cardiovascular:  Beta Blockers Failed - 08/31/2018  1:23 AM      Failed - Valid encounter within last 6 months    Recent Outpatient Visits          7 months ago Essential hypertension   [place] [facility], Jeannette How, MD   9 months ago Essential hypertension   [place] [facility], Jeannette How, MD   1 year ago Essential hypertension   [place] [facility], Jeannette How, MD   1 year ago Essential hypertension   [place], Jeannette How, MD   2 years ago Essential hypertension   [place], Jeannette How, MD             Passed - Last BP in normal range    BP Readings from Last 1 Encounters:  01/09/18 136/70         Passed - Last Heart Rate in normal range    Pulse Readings from Last 1 Encounters:  01/09/18 97

## 2018-08-31 NOTE — Telephone Encounter (Signed)
Needs follow up appointment.  

## 2018-09-04 NOTE — Telephone Encounter (Signed)
Pt has an appt scheduled for 09/14/2018 and would like to know if he can have a refill sent in to last until that appt.

## 2018-09-14 ENCOUNTER — Ambulatory Visit: Payer: Managed Care, Other (non HMO) | Admitting: Family Medicine

## 2018-09-14 ENCOUNTER — Encounter: Payer: Self-pay | Admitting: Family Medicine

## 2018-09-14 ENCOUNTER — Other Ambulatory Visit: Payer: Self-pay

## 2018-09-14 DIAGNOSIS — M159 Polyosteoarthritis, unspecified: Secondary | ICD-10-CM | POA: Diagnosis not present

## 2018-09-14 DIAGNOSIS — I10 Essential (primary) hypertension: Secondary | ICD-10-CM

## 2018-09-14 DIAGNOSIS — F1020 Alcohol dependence, uncomplicated: Secondary | ICD-10-CM

## 2018-09-14 DIAGNOSIS — M1990 Unspecified osteoarthritis, unspecified site: Secondary | ICD-10-CM | POA: Insufficient documentation

## 2018-09-14 MED ORDER — LOSARTAN POTASSIUM 100 MG PO TABS: 100.0000 mg | ORAL_TABLET | Freq: Every day | ORAL | 1 refills | Status: DC

## 2018-09-14 MED ORDER — METOPROLOL SUCCINATE ER 100 MG PO TB24: 100.0000 mg | ORAL_TABLET | Freq: Every day | ORAL | 1 refills | Status: DC

## 2018-09-14 NOTE — Assessment & Plan Note (Signed)
Hypertension will stop hydrochlorothiazide restart losartan observe for cough also continue metoprolol discussed weight and weight loss

## 2018-09-14 NOTE — Assessment & Plan Note (Signed)
Discussed weight loss diet exercise nutrition patient will become active again losing 10 to 20 pounds

## 2018-09-14 NOTE — Progress Notes (Signed)
BP (!) 164/102 (BP Location: Left Arm, Patient Position: Sitting, Cuff Size: Large)   Pulse 89   Temp 98.6 F (37 C)   Ht 6\' 1"  (1.854 m)   Wt 281 lb 4 oz (127.6 kg)   SpO2 99%   BMI 37.11 kg/m    Subjective:    Patient ID: Alexander Ramsey, male    DOB: 07/10/1965, 52 y.o.   MRN: [id number]  HPI: Alexander Ramsey is a 53 y.o. male  Chief Complaint  Patient presents with  . Hypertension    Patient has been out of metoprolol for a week   Patient recheck hypertension blood pressure has not done well since stopping losartan. Losartan was stopped in the event it was causing a cough.  Patient went to ear nose and throat and was felt it was more reflux type issues but is been off losartan and is changed to hydrochlorothiazide with unsatisfactory blood pressure control.  Patient's been taking metoprolol but unfortunately has been out over this last week.  Blood pressures markedly elevated but has not been that good on combination of metoprolol hydrochlorothiazide anyway. Patient also with fluctuating weight going up and down. Patient's also been bothered by arthritis especially in both thumbs limited strength and difficulty opening jars also has and wrists and knees.  Relevant past medical, surgical, family and social history reviewed and updated as indicated. Interim medical history since our last visit reviewed. Allergies and medications reviewed and updated.  Review of Systems  Constitutional: Negative.   Respiratory: Negative.   Cardiovascular: Negative.     Per HPI unless specifically indicated above     Objective:    BP (!) 164/102 (BP Location: Left Arm, Patient Position: Sitting, Cuff Size: Large)   Pulse 89   Temp 98.6 F (37 C)   Ht 6\' 1"  (1.854 m)   Wt 281 lb 4 oz (127.6 kg)   SpO2 99%   BMI 37.11 kg/m   Wt Readings from Last 3 Encounters:  09/14/18 281 lb 4 oz (127.6 kg)  01/09/18 276 lb (125.2 kg)  11/08/17 281 lb (127.5 kg)    Physical Exam  Constitutional:      Appearance: He is well-developed.  HENT:     Head: Normocephalic and atraumatic.  Eyes:     Conjunctiva/sclera: Conjunctivae normal.  Neck:     Musculoskeletal: Normal range of motion.  Cardiovascular:     Rate and Rhythm: Normal rate and regular rhythm.     Heart sounds: Normal heart sounds.  Pulmonary:     Effort: Pulmonary effort is normal.     Breath sounds: Normal breath sounds.  Musculoskeletal: Normal range of motion.  Skin:    Findings: No erythema.  Neurological:     Mental Status: He is alert and oriented to person, place, and time.  Psychiatric:        Behavior: Behavior normal.        Thought Content: Thought content normal.        Judgment: Judgment normal.     Results for orders placed or performed in visit on 57/84/69  Basic metabolic panel  Result Value Ref Range   Glucose 84 65 - 99 mg/dL   BUN 12 6 - 24 mg/dL   Creatinine, Ser 1.06 0.76 - 1.27 mg/dL   GFR calc non Af Amer 81 >59 mL/min/1.73   GFR calc Af Amer 94 >59 mL/min/1.73   BUN/Creatinine Ratio 11 9 - 20   Sodium 142 134 - 144  mmol/L   Potassium 3.6 3.5 - 5.2 mmol/L   Chloride 97 96 - 106 mmol/L   CO2 25 20 - 29 mmol/L   Calcium 9.9 8.7 - 10.2 mg/dL      Assessment & Plan:   Problem List Items Addressed This Visit      Cardiovascular and Mediastinum   Essential hypertension    Hypertension will stop hydrochlorothiazide restart losartan observe for cough also continue metoprolol discussed weight and weight loss      Relevant Medications   metoprolol succinate (TOPROL-XL) 100 MG 24 hr tablet   losartan (COZAAR) 100 MG tablet     Musculoskeletal and Integument   Osteoarthritis    Discussed arthritis care and treatment activity and rest use of over-the-counter medications Weight loss        Other   Alcohol use disorder, severe, dependence ([place])    Currently in remission      Morbid obesity ([place])    Discussed weight loss diet exercise nutrition patient will  become active again losing 10 to 20 pounds          Follow up plan: Return in about 3 months (around 12/15/2018) for Physical Exam.

## 2018-09-14 NOTE — Assessment & Plan Note (Signed)
Currently in remission 

## 2018-09-14 NOTE — Assessment & Plan Note (Signed)
Discussed arthritis care and treatment activity and rest use of over-the-counter medications Weight loss

## 2018-10-19 ENCOUNTER — Encounter: Payer: Managed Care, Other (non HMO) | Admitting: Family Medicine

## 2018-11-19 ENCOUNTER — Other Ambulatory Visit: Payer: Self-pay | Admitting: Family Medicine

## 2018-12-22 ENCOUNTER — Telehealth: Payer: Self-pay | Admitting: Family Medicine

## 2018-12-22 DIAGNOSIS — I10 Essential (primary) hypertension: Secondary | ICD-10-CM

## 2018-12-22 MED ORDER — METOPROLOL SUCCINATE ER 100 MG PO TB24: 100.0000 mg | ORAL_TABLET | Freq: Every day | ORAL | 0 refills | Status: DC

## 2018-12-22 NOTE — Telephone Encounter (Signed)
Called pt and advised that [facility] sent medicine in. Pt states he will check with them as well.

## 2018-12-22 NOTE — Telephone Encounter (Signed)
Looks like he should have a 90 day refill available but just in case it got lost for some reason I just re-sent 90 days in

## 2018-12-22 NOTE — Telephone Encounter (Signed)
Pt presented in office wanting a refill on his metoprolol succinate  100 MG states that he is going out of town at 12:30 and still uses [facility] in [place]. Please advise.

## 2019-02-14 ENCOUNTER — Encounter: Payer: Managed Care, Other (non HMO) | Admitting: Family Medicine

## 2019-03-19 ENCOUNTER — Other Ambulatory Visit: Payer: Self-pay | Admitting: Family Medicine

## 2019-03-19 DIAGNOSIS — I10 Essential (primary) hypertension: Secondary | ICD-10-CM

## 2019-03-19 NOTE — Telephone Encounter (Signed)
Requested medication (s) are due for refill today: yes  Requested medication (s) are on the active medication list: yes  Last refill:  12/22/2018  Future visit scheduled:no  Notes to clinic:  Review for refill Overdue for office viit   Requested Prescriptions  Pending Prescriptions Disp Refills   metoprolol succinate (TOPROL-XL) 100 MG 24 hr tablet [Pharmacy Med Name: METOPROLOL SUCC ER 100 MG TAB] 90 tablet 0    Sig: Take 1 tablet (100 mg total) by mouth daily. Take with or immediately following a meal.     Cardiovascular:  Beta Blockers Failed - 03/19/2019  1:40 AM      Failed - Last BP in normal range    BP Readings from Last 1 Encounters:  09/14/18 (!) 164/102         Failed - Valid encounter within last 6 months    Recent Outpatient Visits          6 months ago Essential hypertension   [facility] Cr[place], Jeannette How, MD   1 year ago Essential hypertension   [place] [facility], Jeannette How, MD   1 year ago Essential hypertension   [place] [facility], Jeannette How, MD   1 year ago Essential hypertension   [place], Jeannette How, MD   2 years ago Essential hypertension   [employer], Jeannette How, MD             Passed - Last Heart Rate in normal range    Pulse Readings from Last 1 Encounters:  09/14/18 89

## 2019-05-02 ENCOUNTER — Other Ambulatory Visit: Payer: Self-pay | Admitting: Family Medicine

## 2019-05-15 ENCOUNTER — Other Ambulatory Visit: Payer: Self-pay

## 2019-05-15 DIAGNOSIS — Z20822 Contact with and (suspected) exposure to covid-19: Secondary | ICD-10-CM

## 2019-05-16 LAB — NOVEL CORONAVIRUS, NAA: SARS-CoV-2, NAA: NOT DETECTED

## 2019-06-05 ENCOUNTER — Other Ambulatory Visit: Payer: Self-pay

## 2019-06-05 NOTE — Telephone Encounter (Signed)
No recent labs, last in 2019.  Patient needs office visit scheduled, once scheduled will send enough medication to last until visit.  Thank you.

## 2019-06-06 ENCOUNTER — Telehealth: Payer: Self-pay | Admitting: Family Medicine

## 2019-06-06 ENCOUNTER — Other Ambulatory Visit: Payer: Self-pay

## 2019-06-06 NOTE — Telephone Encounter (Signed)
Called pt to schedule, no answer, left vm 

## 2019-06-06 NOTE — Telephone Encounter (Signed)
Pt called in to schedule transition / medication follow up visit with a new provider. Pt says that he normally have Dr. Jeananne Rama to give him a Rx with his lab orders printed out. Pt have completed with his jobs medical area for free. Pt would like to pick up Rx when ready. Pt is schedule for visit with provider on Thursday. He would like to have labs completed before apt if possible.

## 2019-06-06 NOTE — Telephone Encounter (Signed)
Routing to [place]. Patient has appointment with you 06/14/19.

## 2019-06-06 NOTE — Telephone Encounter (Signed)
Patient has appointment 06/14/19.

## 2019-06-06 NOTE — Telephone Encounter (Signed)
Please get patient scheduled and send to [facility] when done.

## 2019-06-07 MED ORDER — LOSARTAN POTASSIUM 100 MG PO TABS: 100.0000 mg | ORAL_TABLET | Freq: Every day | ORAL | 0 refills | Status: DC

## 2019-06-07 NOTE — Telephone Encounter (Signed)
Appt scheduled 12/10.

## 2019-06-07 NOTE — Telephone Encounter (Signed)
Does not appear to be a CPE, will write script for a BMP as I'm assuming this is just a BP f/u?

## 2019-06-07 NOTE — Telephone Encounter (Signed)
Patient notified

## 2019-06-13 ENCOUNTER — Other Ambulatory Visit: Payer: Self-pay

## 2019-06-13 ENCOUNTER — Other Ambulatory Visit: Payer: Managed Care, Other (non HMO)

## 2019-06-13 DIAGNOSIS — I10 Essential (primary) hypertension: Secondary | ICD-10-CM

## 2019-06-14 ENCOUNTER — Encounter: Payer: Self-pay | Admitting: Family Medicine

## 2019-06-14 ENCOUNTER — Ambulatory Visit: Payer: Managed Care, Other (non HMO) | Admitting: Family Medicine

## 2019-06-14 ENCOUNTER — Other Ambulatory Visit: Payer: Self-pay

## 2019-06-14 VITALS — BP 169/104 | HR 78 | Temp 98.5°F | Ht 73.0 in | Wt 285.0 lb

## 2019-06-14 DIAGNOSIS — I10 Essential (primary) hypertension: Secondary | ICD-10-CM | POA: Diagnosis not present

## 2019-06-14 DIAGNOSIS — R7309 Other abnormal glucose: Secondary | ICD-10-CM

## 2019-06-14 DIAGNOSIS — K219 Gastro-esophageal reflux disease without esophagitis: Secondary | ICD-10-CM | POA: Diagnosis not present

## 2019-06-14 LAB — BASIC METABOLIC PANEL
BUN/Creatinine Ratio: 9 (ref 9–20)
BUN: 11 mg/dL (ref 6–24)
CO2: 25 mmol/L (ref 20–29)
Calcium: 9.8 mg/dL (ref 8.7–10.2)
Chloride: 97 mmol/L (ref 96–106)
Creatinine, Ser: 1.16 mg/dL (ref 0.76–1.27)
GFR calc Af Amer: 83 mL/min/{1.73_m2} (ref >59)
GFR calc non Af Amer: 72 mL/min/{1.73_m2} (ref >59)
Glucose: 116 mg/dL — ABNORMAL HIGH (ref 65–99)
Potassium: 4.3 mmol/L (ref 3.5–5.2)
Sodium: 140 mmol/L (ref 134–144)

## 2019-06-14 MED ORDER — METOPROLOL SUCCINATE ER 100 MG PO TB24: 100.0000 mg | ORAL_TABLET | Freq: Every day | ORAL | 1 refills | Status: DC

## 2019-06-14 MED ORDER — DEXILANT 60 MG PO CPDR: 60.0000 mg | DELAYED_RELEASE_CAPSULE | Freq: Every day | ORAL | 1 refills | Status: DC

## 2019-06-14 MED ORDER — LOSARTAN POTASSIUM 100 MG PO TABS: 100.0000 mg | ORAL_TABLET | Freq: Every day | ORAL | 1 refills | Status: DC

## 2019-06-14 NOTE — Progress Notes (Signed)
BP (!) 169/104   Pulse 78   Temp 98.5 F (36.9 C) (Oral)   Ht 6\' 1"  (1.854 m)   Wt 285 lb (129.3 kg)   SpO2 97%   BMI 37.60 kg/m    Subjective:    Patient ID: Alexander Ramsey, male    DOB: 05/16/1966, 53 y.o.   MRN: [id number]  HPI: Alexander Ramsey is a 53 y.o. male  Chief Complaint  Patient presents with  . Hypertension    pt states has been out of losartan and dexilant for a couple days now  . Gastroesophageal Reflux  . Medication Refill   Here today for 6 month f/u.   HTN - Home BPs running between 130s/70s. Has been out of his losartan for several days now. Has gained about 25 lb since pandemic, eating more sweets and not exercising now that gyms had been closed. Denies CP, SOB, HAs, dizziness.  GERD - doing very well on dexilant, tolerating well and notes this has taken care of the tickle he had in his throat for months that ENT concluded to be reflux related.   Relevant past medical, surgical, family and social history reviewed and updated as indicated. Interim medical history since our last visit reviewed. Allergies and medications reviewed and updated.  Review of Systems  Per HPI unless specifically indicated above     Objective:    BP (!) 169/104   Pulse 78   Temp 98.5 F (36.9 C) (Oral)   Ht 6\' 1"  (1.854 m)   Wt 285 lb (129.3 kg)   SpO2 97%   BMI 37.60 kg/m   Wt Readings from Last 3 Encounters:  06/14/19 285 lb (129.3 kg)  09/14/18 281 lb 4 oz (127.6 kg)  01/09/18 276 lb (125.2 kg)    Physical Exam Vitals and nursing note reviewed.  Constitutional:      Appearance: Normal appearance.  HENT:     Head: Atraumatic.  Eyes:     Extraocular Movements: Extraocular movements intact.     Conjunctiva/sclera: Conjunctivae normal.  Cardiovascular:     Rate and Rhythm: Normal rate and regular rhythm.  Pulmonary:     Effort: Pulmonary effort is normal.     Breath sounds: Normal breath sounds.  Musculoskeletal:        General: Normal range of  motion.     Cervical back: Normal range of motion and neck supple.  Skin:    General: Skin is warm and dry.  Neurological:     General: No focal deficit present.     Mental Status: He is oriented to person, place, and time.  Psychiatric:        Mood and Affect: Mood normal.        Thought Content: Thought content normal.        Judgment: Judgment normal.     Results for orders placed or performed in visit on 0000000  Basic Metabolic Panel (BMET)  Result Value Ref Range   Glucose 116 (H) 65 - 99 mg/dL   BUN 11 6 - 24 mg/dL   Creatinine, Ser 1.16 0.76 - 1.27 mg/dL   GFR calc non Af Amer 72 >59 mL/min/1.73   GFR calc Af Amer 83 >59 mL/min/1.73   BUN/Creatinine Ratio 9 9 - 20   Sodium 140 134 - 144 mmol/L   Potassium 4.3 3.5 - 5.2 mmol/L   Chloride 97 96 - 106 mmol/L   CO2 25 20 - 29 mmol/L   Calcium 9.8 8.7 -  10.2 mg/dL      Assessment & Plan:   Problem List Items Addressed This Visit      Cardiovascular and Mediastinum   Essential hypertension - Primary    Elevated today but has been off one of his medications. Restart, continue home monitoring. Work on lifestyle modifications and weight loss.       Relevant Medications   losartan (COZAAR) 100 MG tablet   metoprolol succinate (TOPROL-XL) 100 MG 24 hr tablet     Digestive   GERD (gastroesophageal reflux disease)    Stable and under good control, continue dexilant      Relevant Medications   DEXILANT 60 MG capsule    Other Visit Diagnoses    Elevated glucose       Noted on labs drawn yesterday. Pt requesting to recheck in 6 months after some diet and exercise changes, weight loss       Follow up plan: Return in about 6 months (around 12/13/2019) for CPE.

## 2019-06-14 NOTE — Assessment & Plan Note (Signed)
Stable and under good control, continue dexilant

## 2019-06-14 NOTE — Assessment & Plan Note (Signed)
Elevated today but has been off one of his medications. Restart, continue home monitoring. Work on lifestyle modifications and weight loss.

## 2019-12-04 ENCOUNTER — Other Ambulatory Visit: Payer: Self-pay

## 2019-12-04 ENCOUNTER — Emergency Department: Payer: Managed Care, Other (non HMO)

## 2019-12-04 DIAGNOSIS — Z79899 Other long term (current) drug therapy: Secondary | ICD-10-CM | POA: Diagnosis not present

## 2019-12-04 DIAGNOSIS — Z87891 Personal history of nicotine dependence: Secondary | ICD-10-CM | POA: Diagnosis not present

## 2019-12-04 DIAGNOSIS — I10 Essential (primary) hypertension: Secondary | ICD-10-CM | POA: Insufficient documentation

## 2019-12-04 LAB — BASIC METABOLIC PANEL
Anion gap: 15 (ref 5–15)
BUN: 12 mg/dL (ref 6–20)
CO2: 25 mmol/L (ref 22–32)
Calcium: 8.4 mg/dL — ABNORMAL LOW (ref 8.9–10.3)
Chloride: 95 mmol/L — ABNORMAL LOW (ref 98–111)
Creatinine, Ser: 1.33 mg/dL — ABNORMAL HIGH (ref 0.61–1.24)
GFR calc Af Amer: >60 mL/min (ref >60)
GFR calc non Af Amer: >60 mL/min (ref >60)
Glucose, Bld: 103 mg/dL — ABNORMAL HIGH (ref 70–99)
Potassium: 3.2 mmol/L — ABNORMAL LOW (ref 3.5–5.1)
Sodium: 135 mmol/L (ref 135–145)

## 2019-12-04 LAB — CBC
HCT: 45.9 % (ref 39.0–52.0)
Hemoglobin: 15.8 g/dL (ref 13.0–17.0)
MCH: 33.2 pg (ref 26.0–34.0)
MCHC: 34.4 g/dL (ref 30.0–36.0)
MCV: 96.4 fL (ref 80.0–100.0)
Platelets: 202 10*3/uL (ref 150–400)
RBC: 4.76 MIL/uL (ref 4.22–5.81)
RDW: 12.5 % (ref 11.5–15.5)
WBC: 10.9 10*3/uL — ABNORMAL HIGH (ref 4.0–10.5)
nRBC: 0 % (ref 0.0–0.2)

## 2019-12-04 LAB — TROPONIN I (HIGH SENSITIVITY): Troponin I (High Sensitivity): 5 ng/L (ref <18)

## 2019-12-04 NOTE — ED Triage Notes (Addendum)
Pt to ED reporting HTN at home tonight. Pt is unsure how long it has been elevated and states the last time he checked it was a couple weeks ago. Pt reports fatigue throughout the weekend with a headache today that has since subsided.   Pt also reporting intermittent bilateral foot numbness with the feelings of hot and cold.

## 2019-12-05 ENCOUNTER — Emergency Department
Admission: EM | Admit: 2019-12-05 | Discharge: 2019-12-05 | Disposition: A | Discharge status: Home / Self Care | Payer: Managed Care, Other (non HMO) | Attending: Emergency Medicine | Admitting: Emergency Medicine

## 2019-12-05 DIAGNOSIS — I10 Essential (primary) hypertension: Secondary | ICD-10-CM

## 2019-12-05 MED ORDER — CHLORDIAZEPOXIDE HCL 25 MG PO CAPS: ORAL_CAPSULE | ORAL | 0 refills | Status: AC

## 2019-12-05 MED ORDER — METOPROLOL SUCCINATE ER 100 MG PO TB24: 200.0000 mg | ORAL_TABLET | Freq: Every day | ORAL | 1 refills | Status: DC

## 2019-12-05 NOTE — ED Provider Notes (Signed)
[facility] Emergency Department Provider Note   ____________________________________________    I have reviewed the triage vital signs and the nursing notes.   HISTORY  Chief Complaint Hypertension     HPI Alexander Ramsey is a 54 y.o. male who presents with complaints of high blood pressure.  Patient reports has not been feeling well for some time, checked his blood pressure yesterday evening and found to be elevated.  He took an extra dose of his losartan with little improvement.  He does take metoprolol and losartan and has typically had controlled blood pressure.  He does admit that he is a heavy drinker and has not had his usual amount of alcohol in the last 2 days.  He feels mildly jittery but has never had significant symptoms from alcohol withdrawal.  No headaches no neuro deficits.  No chest pain.  No shortness of breath.  No significant edema  Past Medical History:  Diagnosis Date  . Diverticulosis   . GERD (gastroesophageal reflux disease)   . Hypertension   . IBS (irritable bowel syndrome)   . Sleep apnea     Patient Active Problem List   Diagnosis Date Noted  . GERD (gastroesophageal reflux disease)   . Osteoarthritis 09/14/2018  . Special screening for malignant neoplasms, colon   . Benign neoplasm of ascending colon   . Benign neoplasm of transverse colon   . Hypertension 08/15/2017  . Sleep apnea 05/25/2016  . Low libido 05/25/2016  . Dysfunctional family due to alcoholism 03/10/2016  . Morbid obesity ([place]) 03/10/2016  . Alcohol use disorder, severe, dependence ([place]) 03/09/2016  . Essential hypertension 11/25/2015  . Depression, reactive 11/25/2015    Past Surgical History:  Procedure Laterality Date  . COLONOSCOPY  2002 and 2004  . COLONOSCOPY WITH PROPOFOL N/A 10/25/2017   Procedure: COLONOSCOPY WITH PROPOFOL;  Surgeon: Lucilla Lame, MD;  Location: [facility];  Service: Endoscopy;  Laterality: N/A;  . FOOT SURGERY  Left Decemer 23, 2106   "Jones Fracture"    Prior to Admission medications   Medication Sig Start Date End Date Taking? Authorizing Provider  chlordiazePOXIDE (LIBRIUM) 25 MG capsule Take 2 capsules (50 mg total) by mouth in the morning and at bedtime for 1 day, THEN 1 capsule (25 mg total) 4 (four) times daily for 1 day, THEN 1 capsule (25 mg total) in the morning and at bedtime for 1 day, THEN 1 capsule (25 mg total) at bedtime for 1 day. 12/05/19 12/09/19  Lavonia Drafts, MD  DEXILANT 60 MG capsule Take 1 capsule (60 mg total) by mouth daily. 06/14/19   Volney American, PA-C  losartan (COZAAR) 100 MG tablet Take 1 tablet (100 mg total) by mouth daily. 06/14/19   Volney American, PA-C  metoprolol succinate (TOPROL-XL) 100 MG 24 hr tablet Take 2 tablets (200 mg total) by mouth daily. Take with or immediately following a meal. 12/05/19   Lavonia Drafts, MD     Allergies Patient has no known allergies.  Family History  Problem Relation Age of Onset  . Hypertension Mother   . Sleep apnea Mother   . Cancer Father   . Emphysema Father   . Hypertension Sister   . Diabetes Sister   . Cancer Brother   . Hypertension Brother   . Sleep apnea Brother   . Stroke Paternal Grandmother   . Heart disease Neg Hx     Social History Social History   Tobacco Use  . Smoking  status: Former Smoker    Quit date: 11/25/2003    Years since quitting: 16.0  . Smokeless tobacco: Current User    Types: Snuff  Substance Use Topics  . Alcohol use: Yes    Alcohol/week: 0.0 standard drinks    Comment: weekends  . Drug use: No    Review of Systems  Constitutional: No fever/chills Eyes: No visual changes.  ENT: No sore throat. Cardiovascular: As above Respiratory: As above Gastrointestinal: No abdominal pain.  No nausea, no vomiting.   Genitourinary: Negative for dysuria. Musculoskeletal: Negative for back pain. Skin: Negative for rash. Neurological: Negative for headaches     ____________________________________________   PHYSICAL EXAM:  VITAL SIGNS: ED Triage Vitals  Enc Vitals Group     BP 12/04/19 2241 (!) 204/114     Pulse Rate 12/04/19 2241 80     Resp 12/04/19 2241 16     Temp 12/04/19 2241 97.8 F (36.6 C)     Temp Source 12/04/19 2239 Oral     SpO2 12/04/19 2241 100 %     Weight 12/04/19 2240 129.3 kg (285 lb)     Height 12/04/19 2240 1.88 m (6\' 2" )     Head Circumference --      Peak Flow --      Pain Score 12/04/19 2240 0     Pain Loc --      Pain Edu? --      Excl. in [place]? --     Constitutional: Alert and oriented. No acute distress. Pleasant and interactive  Head: Atraumatic. Nose: No congestion/rhinnorhea. Mouth/Throat: Mucous membranes are moist.   Neck:  Painless ROM Cardiovascular: Normal rate, regular rhythm.   Good peripheral circulation. Respiratory: Normal respiratory effort.  No retractions.  Gastrointestinal: Soft and nontender. No distention. .  Musculoskeletal: No lower extremity tenderness nor edema.  Warm and well perfused, mild tremulousness in the arms Neurologic:  Normal speech and language. No gross focal neurologic deficits are appreciated.  Skin:  Skin is warm, dry and intact.  Psychiatric: Mood and affect are normal. Speech and behavior are normal.  ____________________________________________   LABS (all labs ordered are listed, but only abnormal results are displayed)  Labs Reviewed  BASIC METABOLIC PANEL - Abnormal; Notable for the following components:      Result Value   Potassium 3.2 (*)    Chloride 95 (*)    Glucose, Bld 103 (*)    Creatinine, Ser 1.33 (*)    Calcium 8.4 (*)    All other components within normal limits  CBC - Abnormal; Notable for the following components:   WBC 10.9 (*)    All other components within normal limits  TROPONIN I (HIGH SENSITIVITY)  TROPONIN I (HIGH SENSITIVITY)   ____________________________________________  EKG  ED ECG REPORT I, Lavonia Drafts, the  attending physician, personally viewed and interpreted this ECG.  Date: 12/05/2019  Rhythm: normal sinus rhythm QRS Axis: normal Intervals: normal ST/T Wave abnormalities: normal Narrative Interpretation: no evidence of acute ischemia  ____________________________________________  RADIOLOGY  Chest x-ray unremarkable, reviewed by me ____________________________________________   PROCEDURES  Procedure(s) performed: No  Procedures   Critical Care performed: No ____________________________________________   INITIAL IMPRESSION / ASSESSMENT AND PLAN / ED COURSE  Pertinent labs & imaging results that were available during my care of the patient were reviewed by me and considered in my medical decision making (see chart for details).  Patient well-appearing and in no acute distress.  Presents with elevated blood pressure.  Does  have essential hypertension that he reports is usually relatively well controlled.  Does have PCP follow-up within 2 weeks.  I suspect alcohol withdrawal is causing elevated blood pressure.  His lab work today is overall reassuring, mild hypokalemia, EKG is unremarkable.  I will start him on a Librium taper to assist with his efforts to decrease alcohol intake and I suspect this will help his blood pressure as well, will also increase his metoprolol, have urged him to follow-up closely with his PCP.    ____________________________________________   FINAL CLINICAL IMPRESSION(S) / ED DIAGNOSES  Final diagnoses:  Hypertension, unspecified type        Note:  This document was prepared using Dragon voice recognition software and may include unintentional dictation errors.   Lavonia Drafts, MD 12/05/19 [phone]

## 2019-12-11 ENCOUNTER — Other Ambulatory Visit: Payer: Self-pay | Admitting: Family Medicine

## 2019-12-11 NOTE — Telephone Encounter (Signed)
Requested Prescriptions  Pending Prescriptions Disp Refills   DEXILANT 60 MG capsule [Pharmacy Med Name: [place] DR 60 MG CAPSULE] 90 capsule 1    Sig: TAKE 1 CAPSULE BY MOUTH EVERY DAY     Gastroenterology: Proton Pump Inhibitors Passed - 12/11/2019  1:32 AM      Passed - Valid encounter within last 12 months    Recent Outpatient Visits          6 months ago Essential hypertension   [facility] Volney American, [place]   1 year ago Essential hypertension   [place], Jeannette How, [place]   1 year ago Essential hypertension   [place], Jeannette How, MD   2 years ago Essential hypertension   [place], Jeannette How, MD   2 years ago Essential hypertension   [place], Jeannette How, MD      Future Appointments            In 2 weeks Orene Desanctis, Lilia Argue, PA-C [facility], [place]

## 2019-12-13 ENCOUNTER — Ambulatory Visit: Payer: Managed Care, Other (non HMO) | Admitting: Family Medicine

## 2019-12-24 ENCOUNTER — Other Ambulatory Visit: Payer: Self-pay | Admitting: Family Medicine

## 2019-12-24 NOTE — Telephone Encounter (Signed)
Routing to provider  

## 2019-12-24 NOTE — Telephone Encounter (Signed)
Requested medication (s) are due for refill today: yes  Requested medication (s) are on the active medication list: yes  Last refill:  09/19/2019  Future visit scheduled: yes  Notes to clinic:  patient has appointment on 12/26/2019   Requested Prescriptions  Pending Prescriptions Disp Refills   losartan (COZAAR) 100 MG tablet [Pharmacy Med Name: LOSARTAN POTASSIUM 100 MG TAB] 90 tablet 1    Sig: TAKE 1 TABLET BY MOUTH EVERY DAY. *NEED TO MAKE AN APPT*      Cardiovascular:  Angiotensin Receptor Blockers Failed - 12/24/2019  1:28 AM      Failed - Cr in normal range and within 180 days    Creatinine, Ser  Date Value Ref Range Status  12/04/2019 1.33 (H) 0.61 - 1.24 mg/dL Final          Failed - K in normal range and within 180 days    Potassium  Date Value Ref Range Status  12/04/2019 3.2 (L) 3.5 - 5.1 mmol/L Final          Failed - Last BP in normal range    BP Readings from Last 1 Encounters:  12/05/19 (!) 181/93          Failed - Valid encounter within last 6 months    Recent Outpatient Visits           6 months ago Essential hypertension   [facility] Volney American, [place]   1 year ago Essential hypertension   [facility] Guadalupe Maple, MD   1 year ago Essential hypertension   [facility] [place], Jeannette How, MD   2 years ago Essential hypertension   [place], Jeannette How, MD   2 years ago Essential hypertension   [place], Jeannette How, MD       Future Appointments             In 2 days Volney American, PA-C [facility], [place] - Patient is not pregnant

## 2019-12-26 ENCOUNTER — Encounter: Payer: Self-pay | Admitting: Family Medicine

## 2019-12-26 ENCOUNTER — Other Ambulatory Visit: Payer: Self-pay

## 2019-12-26 ENCOUNTER — Ambulatory Visit (INDEPENDENT_AMBULATORY_CARE_PROVIDER_SITE_OTHER): Payer: Managed Care, Other (non HMO) | Admitting: Family Medicine

## 2019-12-26 VITALS — BP 123/82 | HR 99 | Temp 98.4°F | Wt 283.0 lb

## 2019-12-26 DIAGNOSIS — I10 Essential (primary) hypertension: Secondary | ICD-10-CM

## 2019-12-26 DIAGNOSIS — F1020 Alcohol dependence, uncomplicated: Secondary | ICD-10-CM | POA: Diagnosis not present

## 2019-12-26 DIAGNOSIS — R197 Diarrhea, unspecified: Secondary | ICD-10-CM

## 2019-12-26 DIAGNOSIS — R112 Nausea with vomiting, unspecified: Secondary | ICD-10-CM

## 2019-12-26 DIAGNOSIS — R202 Paresthesia of skin: Secondary | ICD-10-CM

## 2019-12-26 DIAGNOSIS — F419 Anxiety disorder, unspecified: Secondary | ICD-10-CM

## 2019-12-26 DIAGNOSIS — R200 Anesthesia of skin: Secondary | ICD-10-CM

## 2019-12-26 MED ORDER — METOPROLOL SUCCINATE ER 100 MG PO TB24: 200.0000 mg | ORAL_TABLET | Freq: Every day | ORAL | 1 refills | Status: DC

## 2019-12-26 MED ORDER — DEXILANT 60 MG PO CPDR: 60.0000 mg | DELAYED_RELEASE_CAPSULE | Freq: Every day | ORAL | 1 refills | Status: DC

## 2019-12-26 MED ORDER — VENLAFAXINE HCL ER 75 MG PO CP24
75.0000 mg | ORAL_CAPSULE | Freq: Every day | ORAL | 1 refills | Status: DC
Start: 2019-12-26 — End: 2020-02-27

## 2019-12-26 MED ORDER — LOSARTAN POTASSIUM 100 MG PO TABS: ORAL_TABLET | ORAL | 1 refills | Status: DC

## 2019-12-26 NOTE — Progress Notes (Signed)
BP 123/82   Pulse 99   Temp 98.4 F (36.9 C) (Oral)   Wt 283 lb (128.4 kg)   SpO2 93%   BMI 36.34 kg/m    Subjective:    Patient ID: Alexander Ramsey, male    DOB: 1965/08/24, 54 y.o.   MRN: [id number]  HPI: Alexander Ramsey is a 54 y.o. male  Chief Complaint  Patient presents with  . Hypertension  . Gastroesophageal Reflux   Here today for 6 month f/u chronic conditions.   Checking home BPs the past few weeks since ER visit for significant HTN, has been running 120s/80s since. Feels his anxiety is playing a large role in his BP issues recently. He had also cut way back on alcohol consumption around the time he went to the ER and wonders if that made a difference. Taking his medication for HTN faithfully without side effects. Denies CP, SOB, HAs, dizziness.   The past few days has been having diarrhea and N/V. Sxs significantly improved this morning. Denies fever, chills, sweats, new foods or known sick contacts. Trying to eat bland diet.   B/l foot numbness and burning the past 2 years progressively worsening.   Depression screen [facility] 2/9 09/14/2018 11/08/2017 12/29/2016  Decreased Interest 0 0 0  Down, Depressed, Hopeless 0 0 0  PHQ - 2 Score 0 0 0  Altered sleeping 0 - -  Tired, decreased energy 0 - -  Change in appetite 0 - -  Feeling bad or failure about yourself  0 - -  Trouble concentrating 0 - -  Moving slowly or fidgety/restless 0 - -  Suicidal thoughts 0 - -  PHQ-9 Score 0 - -  Difficult doing work/chores Not difficult at all - -  Some encounter information is confidential and restricted. Go to Review Flowsheets activity to see all data.  No flowsheet data found.  Relevant past medical, surgical, family and social history reviewed and updated as indicated. Interim medical history since our last visit reviewed. Allergies and medications reviewed and updated.  Review of Systems  Per HPI unless specifically indicated above     Objective:    BP 123/82    Pulse 99   Temp 98.4 F (36.9 C) (Oral)   Wt 283 lb (128.4 kg)   SpO2 93%   BMI 36.34 kg/m   Wt Readings from Last 3 Encounters:  12/26/19 283 lb (128.4 kg)  12/04/19 285 lb (129.3 kg)  06/14/19 285 lb (129.3 kg)    Physical Exam Vitals and nursing note reviewed.  Constitutional:      Appearance: Normal appearance.  HENT:     Head: Atraumatic.  Eyes:     Extraocular Movements: Extraocular movements intact.     Conjunctiva/sclera: Conjunctivae normal.  Cardiovascular:     Rate and Rhythm: Normal rate and regular rhythm.  Pulmonary:     Effort: Pulmonary effort is normal.     Breath sounds: Normal breath sounds.  Abdominal:     General: There is no distension.     Palpations: Abdomen is soft.     Tenderness: There is no abdominal tenderness. There is no right CVA tenderness, left CVA tenderness or guarding.     Comments: Bowel sounds mildly hyperactive  Musculoskeletal:        General: Normal range of motion.     Cervical back: Normal range of motion and neck supple.     Comments: - SLR b/l LEs  Skin:    General: Skin  is warm and dry.  Neurological:     General: No focal deficit present.     Mental Status: He is oriented to person, place, and time.  Psychiatric:        Mood and Affect: Mood normal.        Thought Content: Thought content normal.        Judgment: Judgment normal.     Results for orders placed or performed during the hospital encounter of 56/38/75  Basic metabolic panel  Result Value Ref Range   Sodium 135 135 - 145 mmol/L   Potassium 3.2 (L) 3.5 - 5.1 mmol/L   Chloride 95 (L) 98 - 111 mmol/L   CO2 25 22 - 32 mmol/L   Glucose, Bld 103 (H) 70 - 99 mg/dL   BUN 12 6 - 20 mg/dL   Creatinine, Ser 1.33 (H) 0.61 - 1.24 mg/dL   Calcium 8.4 (L) 8.9 - 10.3 mg/dL   GFR calc non Af Amer >60 >60 mL/min   GFR calc Af Amer >60 >60 mL/min   Anion gap 15 5 - 15  CBC  Result Value Ref Range   WBC 10.9 (H) 4.0 - 10.5 K/uL   RBC 4.76 4.22 - 5.81 MIL/uL    Hemoglobin 15.8 13.0 - 17.0 g/dL   HCT 45.9 39 - 52 %   MCV 96.4 80.0 - 100.0 fL   MCH 33.2 26.0 - 34.0 pg   MCHC 34.4 30.0 - 36.0 g/dL   RDW 12.5 11.5 - 15.5 %   Platelets 202 150 - 400 K/uL   nRBC 0.0 0.0 - 0.2 %  Troponin I (High Sensitivity)  Result Value Ref Range   Troponin I (High Sensitivity) 5 <18 ng/L      Assessment & Plan:   Problem List Items Addressed This Visit      Cardiovascular and Mediastinum   Essential hypertension - Primary    Currently stable and WNL, suspect recent lability a result of anxiety and sudden reduction in alcohol intake. Continue to monitor, lifestyle modifications reviewed. F/u if persistently abnormal      Relevant Medications   metoprolol succinate (TOPROL-XL) 100 MG 24 hr tablet   losartan (COZAAR) 100 MG tablet     Other   Alcohol use disorder, severe, dependence ([place])    Working on cutting back on amount. Declines assistance with this. Discussed safety measures      Anxiety    Start Effexor, continue to monitor for improvement. Recommended counseling      Relevant Medications   venlafaxine XR (EFFEXOR XR) 75 MG 24 hr capsule    Other Visit Diagnoses    Numbness and tingling of foot       Hoping to get some benefit from effexor. Continue to monitor   Nausea vomiting and diarrhea       Consistent with viral illness, monitor closely for resolution. BRAT diet, push fluids. F/u if sxs worsening       Follow up plan: Return in about 6 weeks (around 02/06/2020) for Anxiety.

## 2019-12-30 DIAGNOSIS — F419 Anxiety disorder, unspecified: Secondary | ICD-10-CM | POA: Insufficient documentation

## 2019-12-30 NOTE — Assessment & Plan Note (Signed)
Start Effexor, continue to monitor for improvement. Recommended counseling

## 2019-12-30 NOTE — Assessment & Plan Note (Signed)
Currently stable and WNL, suspect recent lability a result of anxiety and sudden reduction in alcohol intake. Continue to monitor, lifestyle modifications reviewed. F/u if persistently abnormal

## 2019-12-30 NOTE — Assessment & Plan Note (Signed)
Working on cutting back on amount. Declines assistance with this. Discussed safety measures

## 2020-01-16 ENCOUNTER — Other Ambulatory Visit: Payer: Managed Care, Other (non HMO)

## 2020-01-16 ENCOUNTER — Other Ambulatory Visit: Payer: Self-pay

## 2020-01-16 DIAGNOSIS — I10 Essential (primary) hypertension: Secondary | ICD-10-CM

## 2020-01-16 DIAGNOSIS — R7309 Other abnormal glucose: Secondary | ICD-10-CM | POA: Diagnosis not present

## 2020-01-17 LAB — COMPREHENSIVE METABOLIC PANEL
ALT: 51 IU/L — ABNORMAL HIGH (ref 0–44)
AST: 52 IU/L — ABNORMAL HIGH (ref 0–40)
Albumin/Globulin Ratio: 1.6 (ref 1.2–2.2)
Albumin: 4.4 g/dL (ref 3.8–4.9)
Alkaline Phosphatase: 76 IU/L (ref 48–121)
BUN/Creatinine Ratio: 9 (ref 9–20)
BUN: 11 mg/dL (ref 6–24)
Bilirubin Total: 1.2 mg/dL (ref 0.0–1.2)
CO2: 25 mmol/L (ref 20–29)
Calcium: 9.1 mg/dL (ref 8.7–10.2)
Chloride: 94 mmol/L — ABNORMAL LOW (ref 96–106)
Creatinine, Ser: 1.2 mg/dL (ref 0.76–1.27)
GFR calc Af Amer: 80 mL/min/{1.73_m2} (ref >59)
GFR calc non Af Amer: 69 mL/min/{1.73_m2} (ref >59)
Globulin, Total: 2.8 g/dL (ref 1.5–4.5)
Glucose: 104 mg/dL — ABNORMAL HIGH (ref 65–99)
Potassium: 4 mmol/L (ref 3.5–5.2)
Sodium: 136 mmol/L (ref 134–144)
Total Protein: 7.2 g/dL (ref 6.0–8.5)

## 2020-01-17 LAB — HGB A1C W/O EAG: Hgb A1c MFr Bld: 5.6 % (ref 4.8–5.6)

## 2020-01-20 ENCOUNTER — Other Ambulatory Visit: Payer: Self-pay | Admitting: Family Medicine

## 2020-02-06 ENCOUNTER — Ambulatory Visit: Payer: Managed Care, Other (non HMO) | Admitting: Family Medicine

## 2020-02-27 ENCOUNTER — Other Ambulatory Visit: Payer: Self-pay

## 2020-02-27 ENCOUNTER — Ambulatory Visit: Payer: Managed Care, Other (non HMO) | Admitting: Family Medicine

## 2020-02-27 ENCOUNTER — Encounter: Payer: Self-pay | Admitting: Family Medicine

## 2020-02-27 VITALS — BP 160/112 | HR 90 | Temp 99.0°F | Wt 289.0 lb

## 2020-02-27 DIAGNOSIS — I10 Essential (primary) hypertension: Secondary | ICD-10-CM | POA: Diagnosis not present

## 2020-02-27 DIAGNOSIS — F419 Anxiety disorder, unspecified: Secondary | ICD-10-CM

## 2020-02-27 DIAGNOSIS — G4730 Sleep apnea, unspecified: Secondary | ICD-10-CM

## 2020-02-27 MED ORDER — HYDROXYZINE HCL 25 MG PO TABS: 25.0000 mg | ORAL_TABLET | Freq: Three times a day (TID) | ORAL | 1 refills | Status: DC | PRN

## 2020-02-27 NOTE — Assessment & Plan Note (Signed)
No benefit with effexor, trial prn hydroxyzine in the evenings. Discussed thought redirection, exercise, and other relaxation techniques

## 2020-02-27 NOTE — Assessment & Plan Note (Signed)
BP elevated today, continue to monitor home readings and work on lifestyle changes. Call if having persistently high readings at home prior to scheduled 6 month f/u

## 2020-02-27 NOTE — Progress Notes (Signed)
BP (!) 160/112   Pulse 90   Temp 99 F (37.2 C) (Oral)   Wt 289 lb (131.1 kg)   SpO2 97%   BMI 37.11 kg/m    Subjective:    Patient ID: Alexander Ramsey, male    DOB: 1966/07/03, 54 y.o.   MRN: [id number]  HPI: Alexander Ramsey is a 54 y.o. male  Chief Complaint  Patient presents with  . Anxiety    pt states he stopped taking the venlafaxine due to med not helping    Here today for anxiety recheck. Started on effexor about 2 months ago but has since stopped it as he didn't feel it was helping. Tends to get anxious in the evenings and notes his mind races, can't sleep during this time. Denies SI/HI, depressed moods, severe mood swings.   Home BPs running 120s/70s range consistently at home. Taking medications faithfully. Denies CP, SOB, dizziness.   Depression screen [facility] 2/9 02/27/2020 09/14/2018 11/08/2017  Decreased Interest 1 0 0  Down, Depressed, Hopeless 0 0 0  PHQ - 2 Score 1 0 0  Altered sleeping 1 0 -  Tired, decreased energy 1 0 -  Change in appetite 0 0 -  Feeling bad or failure about yourself  1 0 -  Trouble concentrating 0 0 -  Moving slowly or fidgety/restless 0 0 -  Suicidal thoughts 0 0 -  PHQ-9 Score 4 0 -  Difficult doing work/chores - Not difficult at all -  Some encounter information is confidential and restricted. Go to Review Flowsheets activity to see all data.   GAD 7 : Generalized Anxiety Score 02/27/2020  Nervous, Anxious, on Edge 0  Control/stop worrying 0  Worry too much - different things 2  Trouble relaxing 0  Restless 0  Easily annoyed or irritable 0  Afraid - awful might happen 0  Total GAD 7 Score 2  Anxiety Difficulty Not difficult at all  Some encounter information is confidential and restricted. Go to Review Flowsheets activity to see all data.   Relevant past medical, surgical, family and social history reviewed and updated as indicated. Interim medical history since our last visit reviewed. Allergies and medications reviewed and  updated.  Review of Systems  Per HPI unless specifically indicated above     Objective:    BP (!) 160/112   Pulse 90   Temp 99 F (37.2 C) (Oral)   Wt 289 lb (131.1 kg)   SpO2 97%   BMI 37.11 kg/m   Wt Readings from Last 3 Encounters:  02/27/20 289 lb (131.1 kg)  12/26/19 283 lb (128.4 kg)  12/04/19 285 lb (129.3 kg)    Physical Exam Vitals and nursing note reviewed.  Constitutional:      Appearance: Normal appearance.  HENT:     Head: Atraumatic.  Eyes:     Extraocular Movements: Extraocular movements intact.     Conjunctiva/sclera: Conjunctivae normal.  Cardiovascular:     Rate and Rhythm: Normal rate and regular rhythm.  Pulmonary:     Effort: Pulmonary effort is normal.     Breath sounds: Normal breath sounds.  Musculoskeletal:        General: Normal range of motion.     Cervical back: Normal range of motion and neck supple.  Skin:    General: Skin is warm and dry.  Neurological:     General: No focal deficit present.     Mental Status: He is oriented to person, place, and time.  Psychiatric:        Mood and Affect: Mood normal.        Thought Content: Thought content normal.        Judgment: Judgment normal.     Results for orders placed or performed in visit on 01/16/20  Comprehensive metabolic panel  Result Value Ref Range   Glucose 104 (H) 65 - 99 mg/dL   BUN 11 6 - 24 mg/dL   Creatinine, Ser 1.20 0.76 - 1.27 mg/dL   GFR calc non Af Amer 69 >59 mL/min/1.73   GFR calc Af Amer 80 >59 mL/min/1.73   BUN/Creatinine Ratio 9 9 - 20   Sodium 136 134 - 144 mmol/L   Potassium 4.0 3.5 - 5.2 mmol/L   Chloride 94 (L) 96 - 106 mmol/L   CO2 25 20 - 29 mmol/L   Calcium 9.1 8.7 - 10.2 mg/dL   Total Protein 7.2 6.0 - 8.5 g/dL   Albumin 4.4 3.8 - 4.9 g/dL   Globulin, Total 2.8 1.5 - 4.5 g/dL   Albumin/Globulin Ratio 1.6 1.2 - 2.2   Bilirubin Total 1.2 0.0 - 1.2 mg/dL   Alkaline Phosphatase 76 48 - 121 IU/L   AST 52 (H) 0 - 40 IU/L   ALT 51 (H) 0 - 44 IU/L    Hgb A1c w/o eAG  Result Value Ref Range   Hgb A1c MFr Bld 5.6 4.8 - 5.6 %      Assessment & Plan:   Problem List Items Addressed This Visit      Cardiovascular and Mediastinum   Essential hypertension    BP elevated today, continue to monitor home readings and work on lifestyle changes. Call if having persistently high readings at home prior to scheduled 6 month f/u        Respiratory   Sleep apnea - Primary    Requesting script for new CPAP machine, his has broken about 2 weeks ago. Previously used faithfully with good results. States last sleep study was over 10 years ago        Other   Anxiety    No benefit with effexor, trial prn hydroxyzine in the evenings. Discussed thought redirection, exercise, and other relaxation techniques      Relevant Medications   hydrOXYzine (ATARAX/VISTARIL) 25 MG tablet       Follow up plan: Return in about 4 months (around 06/28/2020) for 6 month f/u.

## 2020-02-27 NOTE — Assessment & Plan Note (Signed)
Requesting script for new CPAP machine, his has broken about 2 weeks ago. Previously used faithfully with good results. States last sleep study was over 10 years ago

## 2020-02-29 ENCOUNTER — Telehealth: Payer: Self-pay

## 2020-02-29 NOTE — Telephone Encounter (Signed)
Patient notified his CPAP Rx is ready. Pt states he will come to pick it up today.

## 2020-03-21 ENCOUNTER — Other Ambulatory Visit: Payer: Self-pay | Admitting: Family Medicine

## 2020-03-21 NOTE — Telephone Encounter (Signed)
Routing to provider  

## 2020-03-21 NOTE — Telephone Encounter (Signed)
Requested medication (s) are due for refill today: No  Requested medication (s) are on the active medication list: Yes  Last refill:  02/27/20  Future visit scheduled: No  Notes to clinic:  Pharmacy requesting 90 supply.    Requested Prescriptions  Pending Prescriptions Disp Refills   hydrOXYzine (ATARAX/VISTARIL) 25 MG tablet [Pharmacy Med Name: HYDROXYZINE HCL 25 MG TABLET] 270 tablet 1    Sig: Take 1 tablet (25 mg total) by mouth every 8 (eight) hours as needed.      Ear, Nose, and Throat:  Antihistamines Passed - 03/21/2020 10:31 AM      Passed - Valid encounter within last 12 months    Recent Outpatient Visits           3 weeks ago Sleep apnea, unspecified type   [facility] [place], [place]   2 months ago Essential hypertension   [place], [place], [place]   9 months ago Essential hypertension   [facility] [place], [place]   1 year ago Essential hypertension   [place], [place]annette How, [place]   2 years ago Essential hypertension   [facility] [place], Jeannette How, [place]

## 2020-06-04 ENCOUNTER — Other Ambulatory Visit: Payer: Self-pay | Admitting: Family Medicine

## 2020-06-04 DIAGNOSIS — I10 Essential (primary) hypertension: Secondary | ICD-10-CM

## 2020-06-17 ENCOUNTER — Other Ambulatory Visit: Payer: Self-pay | Admitting: Family Medicine

## 2020-09-13 ENCOUNTER — Other Ambulatory Visit: Payer: Self-pay | Admitting: Family Medicine

## 2020-09-13 NOTE — Telephone Encounter (Signed)
Requested medications are due for refill today yes  Requested medications are on the active medication list yes  Last refill 06/2020  Last visit 02/2020  Future visit scheduled no, was to return in Dec 2021  Notes to clinic OV note from [place], [place] states return in Dec 2021, no upcoming appt. Already had curtesy refill

## 2020-09-13 NOTE — Telephone Encounter (Signed)
Requested medications are due for refill today yes  Requested medications are on the active medication list yes  Last refill 12/21  Last visit 02/2020  Future visit scheduled No, was to return in Dec. Has already had curtesy refills.  Notes to clinic OV note from [place], [place] states return in Dec 2021, no upcoming appt. Already had curtesy refill

## 2020-09-15 NOTE — Telephone Encounter (Signed)
Called to scheduled appt no answer left vm

## 2020-09-15 NOTE — Telephone Encounter (Signed)
Called pt no answer left vm 

## 2020-09-15 NOTE — Telephone Encounter (Signed)
Patient needs an appointment prior to refill

## 2020-09-17 NOTE — Telephone Encounter (Signed)
Pt has apt on 09/23/2020

## 2020-09-17 NOTE — Telephone Encounter (Signed)
FYI pt has apt on 09/23/2020 routing due to unable to done.

## 2020-09-23 ENCOUNTER — Ambulatory Visit: Payer: Managed Care, Other (non HMO) | Admitting: Internal Medicine

## 2020-09-29 ENCOUNTER — Ambulatory Visit: Payer: Managed Care, Other (non HMO) | Admitting: Internal Medicine

## 2020-09-29 ENCOUNTER — Other Ambulatory Visit: Payer: Self-pay

## 2020-09-29 ENCOUNTER — Encounter: Payer: Self-pay | Admitting: Internal Medicine

## 2020-09-29 VITALS — BP 178/113 | HR 120 | Temp 98.7°F | Ht 72.84 in | Wt 281.0 lb

## 2020-09-29 DIAGNOSIS — G4730 Sleep apnea, unspecified: Secondary | ICD-10-CM

## 2020-09-29 DIAGNOSIS — M25569 Pain in unspecified knee: Secondary | ICD-10-CM | POA: Diagnosis not present

## 2020-09-29 DIAGNOSIS — G629 Polyneuropathy, unspecified: Secondary | ICD-10-CM

## 2020-09-29 MED ORDER — FAMOTIDINE 20 MG PO TABS: 20.0000 mg | ORAL_TABLET | Freq: Every day | ORAL | 5 refills | Status: AC

## 2020-09-29 NOTE — Progress Notes (Signed)
BP (!) 178/113   Pulse (!) 120   Temp 98.7 F (37.1 C) (Oral)   Ht 6' 0.84" (1.85 m)   Wt 281 lb (127.5 kg)   SpO2 96%   BMI 37.24 kg/m    Subjective:    Patient ID: Alexander Ramsey, male    DOB: [birth year], 55 y.o.   MRN: [id number]  HPI: Alexander Ramsey is a 55 y.o. male  Medication Refill Pertinent negatives include no abdominal pain, arthralgias, chest pain, chills, congestion, coughing, fatigue, fever, headaches, joint swelling, myalgias, nausea, numbness, rash, vomiting or weakness.  Hypertension This is a chronic (bp higher at OV per pt - takes it at home - last night 138/80 mm hg. on losartan and metoprolol ) problem. The current episode started more than 1 year ago. Pertinent negatives include no chest pain, headaches, palpitations or shortness of breath.  Gastroesophageal Reflux He reports no abdominal pain, no belching, no chest pain, no coughing, no early satiety, no globus sensation, no heartburn, no nausea or no wheezing. on dexilant for such x about 3 - 4 yrs.. Pertinent negatives include no fatigue.  Back Pain Pertinent negatives include no abdominal pain, chest pain, dysuria, fever, headaches, numbness or weakness.  Arthritis Presents for initial visit. He reports no joint swelling. (On dexilant for such x about 3 - 4 yrs.) Pertinent negatives include no diarrhea, dysuria, fatigue, fever or rash.  Neurologic Problem The patient's pertinent negatives include no weakness. Primary symptoms comment: on dexilant for such x about 3 - 4 yrs.. This is a chronic (neuropathy in bil feet x 4 yrs ago over last year  last year got worse. ) problem. Associated symptoms include back pain. Pertinent negatives include no abdominal pain, chest pain, confusion, dizziness, fatigue, fever, headaches, light-headedness, nausea, palpitations, shortness of breath or vomiting.    Chief Complaint  Patient presents with  . Medication Refill    Relevant past medical, surgical, family and  social history reviewed and updated as indicated. Interim medical history since our last visit reviewed. Allergies and medications reviewed and updated.  Review of Systems  Constitutional: Negative for activity change, appetite change, chills, fatigue and fever.  HENT: Negative for congestion, ear discharge, ear pain and facial swelling.   Eyes: Negative for pain, discharge and itching.  Respiratory: Negative for cough, chest tightness, shortness of breath and wheezing.   Cardiovascular: Negative for chest pain, palpitations and leg swelling.  Gastrointestinal: Negative for abdominal distention, abdominal pain, blood in stool, constipation, diarrhea, heartburn, nausea and vomiting.  Endocrine: Negative for cold intolerance, heat intolerance, polydipsia, polyphagia and polyuria.  Genitourinary: Negative for difficulty urinating, dysuria, flank pain, frequency, hematuria and urgency.  Musculoskeletal: Positive for arthritis and back pain. Negative for arthralgias, gait problem, joint swelling and myalgias.  Skin: Negative for color change, rash and wound.  Neurological: Negative for dizziness, tremors, speech difficulty, weakness, light-headedness, numbness and headaches.  Hematological: Does not bruise/bleed easily.  Psychiatric/Behavioral: Negative for agitation, confusion, decreased concentration, sleep disturbance and suicidal ideas.    Per HPI unless specifically indicated above     Objective:    BP (!) 178/113   Pulse (!) 120   Temp 98.7 F (37.1 C) (Oral)   Ht 6' 0.84" (1.85 m)   Wt 281 lb (127.5 kg)   SpO2 96%   BMI 37.24 kg/m   Wt Readings from Last 3 Encounters:  09/29/20 281 lb (127.5 kg)  02/27/20 289 lb (131.1 kg)  12/26/19 283 lb (128.4 kg)  Physical Exam Vitals and nursing note reviewed.  Constitutional:      General: He is not in acute distress.    Appearance: Normal appearance. He is not ill-appearing or diaphoretic.  HENT:     Head: Normocephalic and  atraumatic.     Right Ear: Tympanic membrane and external ear normal. There is no impacted cerumen.     Left Ear: External ear normal.     Nose: No congestion or rhinorrhea.     Mouth/Throat:     Pharynx: No oropharyngeal exudate or posterior oropharyngeal erythema.  Eyes:     Conjunctiva/sclera: Conjunctivae normal.     Pupils: Pupils are equal, round, and reactive to light.  Cardiovascular:     Rate and Rhythm: Normal rate and regular rhythm.     Heart sounds: No murmur heard. No friction rub. No gallop.   Pulmonary:     Effort: No respiratory distress.     Breath sounds: No stridor. No wheezing or rhonchi.  Chest:     Chest wall: No tenderness.  Abdominal:     General: Abdomen is flat. Bowel sounds are normal.     Palpations: Abdomen is soft. There is no mass.     Tenderness: There is no abdominal tenderness.  Musculoskeletal:     Cervical back: Normal range of motion and neck supple. No rigidity or tenderness.     Left lower leg: No edema.  Skin:    General: Skin is warm and dry.  Neurological:     Mental Status: He is alert.     Results for orders placed or performed in visit on 01/16/20  Comprehensive metabolic panel  Result Value Ref Range   Glucose 104 (H) 65 - 99 mg/dL   BUN 11 6 - 24 mg/dL   Creatinine, Ser 1.20 0.76 - 1.27 mg/dL   GFR calc non Af Amer 69 >59 mL/min/1.73   GFR calc Af Amer 80 >59 mL/min/1.73   BUN/Creatinine Ratio 9 9 - 20   Sodium 136 134 - 144 mmol/L   Potassium 4.0 3.5 - 5.2 mmol/L   Chloride 94 (L) 96 - 106 mmol/L   CO2 25 20 - 29 mmol/L   Calcium 9.1 8.7 - 10.2 mg/dL   Total Protein 7.2 6.0 - 8.5 g/dL   Albumin 4.4 3.8 - 4.9 g/dL   Globulin, Total 2.8 1.5 - 4.5 g/dL   Albumin/Globulin Ratio 1.6 1.2 - 2.2   Bilirubin Total 1.2 0.0 - 1.2 mg/dL   Alkaline Phosphatase 76 48 - 121 IU/L   AST 52 (H) 0 - 40 IU/L   ALT 51 (H) 0 - 44 IU/L  Hgb A1c w/o eAG  Result Value Ref Range   Hgb A1c MFr Bld 5.6 4.8 - 5.6 %        Current  Outpatient Medications:  .  DEXILANT 60 MG capsule, TAKE 1 CAPSULE BY MOUTH EVERY DAY, Disp: 90 capsule, Rfl: 1 .  famotidine (PEPCID) 20 MG tablet, Take 1 tablet (20 mg total) by mouth at bedtime., Disp: 30 tablet, Rfl: 5 .  losartan (COZAAR) 100 MG tablet, TAKE 1 TABLET BY MOUTH EVERY DAY. *NEED TO MAKE AN APPT*, Disp: 90 tablet, Rfl: 1 .  metoprolol succinate (TOPROL-XL) 100 MG 24 hr tablet, Take 2 tablets (200 mg total) by mouth daily. Take with or immediately following a meal., Disp: 90 tablet, Rfl: 1 .  hydrOXYzine (ATARAX/VISTARIL) 25 MG tablet, TAKE 1 TABLET BY MOUTH EVERY 8 HOURS AS NEEDED. (Patient not taking:  Reported on 09/29/2020), Disp: 270 tablet, Rfl: 0    Assessment & Plan:  1. GERD :  GERD is on dexilant continue  day as prescribed patient advised to avoid laying down soon after his meals. He took a 2 hours between dinner and bedtime. Avoid spicy food and triggers that he knows food wise that worsen his acid reflux. Patient verbalized understanding of the above. Lifestyle modifications as above discussed with patient.  2. OA check Xryas knees and hand  3. Lower back pain chronic, stable.   4. Peripheral neuropathy a1c 5.6 Recheck a1c   5. Sleep apnea is on cpap x 2 yrs ago  When mask breaks needs new masks.  ? Needs to    Problem List Items Addressed This Visit   None      Follow up plan: No follow-ups on file.

## 2020-10-01 ENCOUNTER — Other Ambulatory Visit: Payer: Managed Care, Other (non HMO)

## 2020-10-01 ENCOUNTER — Other Ambulatory Visit: Payer: Self-pay

## 2020-10-01 DIAGNOSIS — G629 Polyneuropathy, unspecified: Secondary | ICD-10-CM

## 2020-10-01 DIAGNOSIS — M25569 Pain in unspecified knee: Secondary | ICD-10-CM

## 2020-10-03 LAB — ANA+ENA+DNA/DS+ANTICH+CENTR
ANA Titer 1: NEGATIVE
Anti JO-1: <0.2 AI (ref 0.0–0.9)
Centromere Ab Screen: 0.2 AI (ref 0.0–0.9)
Chromatin Ab SerPl-aCnc: <0.2 AI (ref 0.0–0.9)
ENA RNP Ab: <0.2 AI (ref 0.0–0.9)
ENA SM Ab Ser-aCnc: <0.2 AI (ref 0.0–0.9)
ENA SSA (RO) Ab: 0.3 AI (ref 0.0–0.9)
ENA SSB (LA) Ab: <0.2 AI (ref 0.0–0.9)
Scleroderma (Scl-70) (ENA) Antibody, IgG: <0.2 AI (ref 0.0–0.9)
dsDNA Ab: <1 IU/mL (ref 0–9)

## 2020-10-03 LAB — COMPREHENSIVE METABOLIC PANEL
ALT: 84 IU/L — ABNORMAL HIGH (ref 0–44)
AST: 67 IU/L — ABNORMAL HIGH (ref 0–40)
Albumin/Globulin Ratio: 1.6 (ref 1.2–2.2)
Albumin: 4.8 g/dL (ref 3.8–4.9)
Alkaline Phosphatase: 61 IU/L (ref 44–121)
BUN/Creatinine Ratio: 11 (ref 9–20)
BUN: 11 mg/dL (ref 6–24)
Bilirubin Total: 0.5 mg/dL (ref 0.0–1.2)
CO2: 18 mmol/L — ABNORMAL LOW (ref 20–29)
Calcium: 9.7 mg/dL (ref 8.7–10.2)
Chloride: 102 mmol/L (ref 96–106)
Creatinine, Ser: 1.04 mg/dL (ref 0.76–1.27)
Globulin, Total: 3 g/dL (ref 1.5–4.5)
Glucose: 110 mg/dL — ABNORMAL HIGH (ref 65–99)
Potassium: 4.3 mmol/L (ref 3.5–5.2)
Sodium: 142 mmol/L (ref 134–144)
Total Protein: 7.8 g/dL (ref 6.0–8.5)
eGFR: 86 mL/min/{1.73_m2} (ref >59)

## 2020-10-03 LAB — CBC WITH DIFFERENTIAL/PLATELET
Basophils Absolute: 0.1 10*3/uL (ref 0.0–0.2)
Basos: 1 %
EOS (ABSOLUTE): 0.2 10*3/uL (ref 0.0–0.4)
Eos: 2 %
Hematocrit: 46 % (ref 37.5–51.0)
Hemoglobin: 15.8 g/dL (ref 13.0–17.7)
Immature Grans (Abs): 0 10*3/uL (ref 0.0–0.1)
Immature Granulocytes: 0 %
Lymphocytes Absolute: 1.6 10*3/uL (ref 0.7–3.1)
Lymphs: 22 %
MCH: 33.3 pg — ABNORMAL HIGH (ref 26.6–33.0)
MCHC: 34.3 g/dL (ref 31.5–35.7)
MCV: 97 fL (ref 79–97)
Monocytes Absolute: 0.7 10*3/uL (ref 0.1–0.9)
Monocytes: 9 %
Neutrophils Absolute: 4.7 10*3/uL (ref 1.4–7.0)
Neutrophils: 66 %
Platelets: 192 10*3/uL (ref 150–450)
RBC: 4.75 x10E6/uL (ref 4.14–5.80)
RDW: 13 % (ref 11.6–15.4)
WBC: 7.2 10*3/uL (ref 3.4–10.8)

## 2020-10-03 LAB — LYME AB/WESTERN BLOT REFLEX
LYME DISEASE AB, QUANT, IGM: <0.8 index (ref 0.00–0.79)
Lyme IgG/IgM Ab: <0.91 {ISR} (ref 0.00–0.90)

## 2020-10-03 LAB — RHEUMATOID FACTOR: Rheumatoid fact SerPl-aCnc: 29.3 IU/mL — ABNORMAL HIGH (ref <14.0)

## 2020-10-03 LAB — FOLATE: Folate: 9.8 ng/mL (ref >3.0)

## 2020-10-03 LAB — URIC ACID: Uric Acid: 6 mg/dL (ref 3.8–8.4)

## 2020-10-03 LAB — TSH: TSH: 2.17 u[IU]/mL (ref 0.450–4.500)

## 2020-10-03 LAB — ANA: Anti Nuclear Antibody (ANA): NEGATIVE

## 2020-10-03 LAB — HGB A1C W/O EAG: Hgb A1c MFr Bld: 5.2 % (ref 4.8–5.6)

## 2020-10-03 LAB — VITAMIN B12: Vitamin B-12: 668 pg/mL (ref 232–1245)

## 2020-10-06 ENCOUNTER — Other Ambulatory Visit: Payer: Self-pay

## 2020-10-06 ENCOUNTER — Ambulatory Visit: Payer: Managed Care, Other (non HMO) | Admitting: Nurse Practitioner

## 2020-10-06 ENCOUNTER — Encounter: Payer: Self-pay | Admitting: Nurse Practitioner

## 2020-10-06 VITALS — BP 157/90 | HR 103 | Temp 99.0°F | Wt 281.2 lb

## 2020-10-06 DIAGNOSIS — M159 Polyosteoarthritis, unspecified: Secondary | ICD-10-CM | POA: Diagnosis not present

## 2020-10-06 DIAGNOSIS — M25569 Pain in unspecified knee: Secondary | ICD-10-CM | POA: Diagnosis not present

## 2020-10-06 NOTE — Assessment & Plan Note (Addendum)
Chronic pain in bilateral knees and wrists. Has not gone for x-rays yet, will go tomorrow. Verified order is in. Can use voltaren gel OTC to help with pain. Avoid tylenol due to slightly elevated liver function. Follow-up in 1 week to review x-rays/possible knee injection

## 2020-10-06 NOTE — Patient Instructions (Addendum)
It was great to see you!  Can try voltaren gel over the counter up to 4 times a day on your knees.   Let's follow-up in 1 week, sooner if you have concerns.  Take care,  Vance Peper, NP   Nonalcoholic Fatty Liver Disease Diet, Adult Nonalcoholic fatty liver disease is a condition that causes fat to build up in and around the liver. The disease makes it harder for the liver to work the way that it should. Following a healthy diet can help to keep nonalcoholic fatty liver disease under control. It can also help to prevent or improve conditions that are associated with the disease, such as heart disease, diabetes, high blood pressure, and abnormal cholesterol levels. Along with regular exercise, this diet:  Promotes weight loss.  Helps to control blood sugar levels.  Helps to improve the way that the body uses insulin. What are tips for following this plan? Reading food labels Always check food labels for:  The amount of saturated fat in a food. You should limit your intake of saturated fat. Saturated fat is found in foods that come from animals, including meat and dairy products such as butter, cheese, and whole milk.  The amount of fiber in a food. You should choose high-fiber foods such as fruits, vegetables, and whole grains. Try to get 25-30 grams (g) of fiber a day.   Cooking  When cooking, use heart-healthy oils that are high in monounsaturated fats. These include olive oil, canola oil, and avocado oil.  Limit frying or deep-frying foods. Cook foods using healthy methods such as baking, boiling, steaming, and grilling instead. Meal planning  You may want to keep track of how many calories you take in. Eating the right amount of calories will help you achieve a healthy weight. Meeting with a registered dietitian can help you get started.  Limit how often you eat takeout and fast food. These foods are usually very high in fat, salt, and sugar.  Use the glycemic index (GI)  to plan your meals. The index tells you how quickly a food will raise your blood sugar. Choose low-GI foods (GI less than 55). These foods take a longer time to raise blood sugar. A registered dietitian can help you identify foods lower on the GI scale. Lifestyle  You may want to follow a Mediterranean diet. This diet includes a lot of vegetables, lean meats or fish, whole grains, fruits, and healthy oils and fats. What foods can I eat? Fruits Bananas. Apples. Oranges. Grapes. Papaya. Mango. Pomegranate. Kiwi. Grapefruit. Cherries. Vegetables Lettuce. Spinach. Peas. Beets. Cauliflower. Cabbage. Broccoli. Carrots. Tomatoes. Squash. Eggplant. Herbs. Peppers. Onions. Cucumbers. Brussels sprouts. Yams and sweet potatoes. Beans. Lentils. Grains Whole wheat or whole-grain foods, including breads, crackers, cereals, and pasta. Stone-ground whole wheat. Unsweetened oatmeal. Bulgur. Barley. Quinoa. Brown or wild rice. Corn or whole wheat flour tortillas. Meats and other proteins Lean meats. Poultry. Tofu. Seafood and shellfish. Dairy Low-fat or fat-free dairy products, such as yogurt, cottage cheese, or cheese. Beverages Water. Sugar-free drinks. Tea. Coffee. Low-fat or skim milk. Milk alternatives, such as soy or almond milk. Real fruit juice. Fats and oils Avocado. Canola or olive oil. Nuts and nut butters. Seeds. Seasonings and condiments Mustard. Relish. Low-fat, low-sugar ketchup and barbecue sauce. Low-fat or fat-free mayonnaise. Sweets and desserts Sugar-free sweets. The items listed above may not be a complete list of foods and beverages you can eat. Contact a dietitian for more information.   What foods should I limit  or avoid? Meats and other proteins Limit red meat to 1-2 times a week. Dairy [employer]. Fats and oils Palm oil and coconut oil. Fried foods. Other foods Processed foods. Foods that contain a lot of salt or sodium. Sweets and desserts Sweets that contain  sugar. Beverages Sweetened drinks, such as sweet tea, milkshakes, iced sweet drinks, and sodas. Alcohol. The items listed above may not be a complete list of foods and beverages you should avoid. Contact a dietitian for more information. Where to find more information The [employer] of Diabetes and Digestive and Kidney Diseases: [employer] Summary  Nonalcoholic fatty liver disease is a condition that causes fat to build up in and around the liver.  Following a healthy diet can help to keep nonalcoholic fatty liver disease under control. Your diet should be rich in fruits, vegetables, whole grains, and lean proteins.  Limit your intake of saturated fat. Saturated fat is found in foods that come from animals, including meat and dairy products such as butter, cheese, and whole milk.  This diet promotes weight loss, helps to control blood sugar levels, and helps to improve the way that the body uses insulin. This information is not intended to replace advice given to you by your health care provider. Make sure you discuss any questions you have with your health care provider. Document Revised: 10/13/2018 Document Reviewed: 07/13/2018 Elsevier Patient Education  [place].

## 2020-10-06 NOTE — Progress Notes (Signed)
Acute Office Visit  Subjective:    Patient ID: Alexander Ramsey, male    DOB: [birth year], 55 y.o.   MRN: [id number]  Chief Complaint  Patient presents with  . Follow-up    1 week follow up- discuss labs    HPI Patient is in today for review of lab results and knee pain.   KNEE PAIN  Bilateral chronic knee pain. His knee pain has been chronic in nature, however has been worsening recently. He feels his knee "crunches" sometimes when he bends and straightens it. He has been putting a horse rub for tendonitis on his knee.    Past Medical History:  Diagnosis Date  . Diverticulosis   . GERD (gastroesophageal reflux disease)   . Hypertension   . IBS (irritable bowel syndrome)   . Sleep apnea     Past Surgical History:  Procedure Laterality Date  . COLONOSCOPY  2002 and 2004  . COLONOSCOPY WITH PROPOFOL N/A 10/25/2017   Procedure: COLONOSCOPY WITH PROPOFOL;  Surgeon: Lucilla Lame, MD;  Location: [facility] ENDOSCOPY;  Service: Endoscopy;  Laterality: N/A;  . FOOT SURGERY Left Decemer 23, 2106   "Jones Fracture"    Family History  Problem Relation Age of Onset  . Hypertension Mother   . Sleep apnea Mother   . Cancer Father   . Emphysema Father   . Hypertension Sister   . Diabetes Sister   . Cancer Brother   . Hypertension Brother   . Sleep apnea Brother   . Stroke Paternal Grandmother   . Heart disease Neg Hx     Social History   Socioeconomic History  . Marital status: Legally Separated    Spouse name: Not on file  . Number of children: Not on file  . Years of education: Not on file  . Highest education level: Not on file  Occupational History  . Not on file  Tobacco Use  . Smoking status: Former Smoker    Quit date: 11/25/2003    Years since quitting: 16.8  . Smokeless tobacco: Current User    Types: Snuff  Vaping Use  . Vaping Use: Never used  Substance and Sexual Activity  . Alcohol use: Yes    Alcohol/week: 0.0 standard drinks    Comment: weekends  .  Drug use: No  . Sexual activity: Not on file  Other Topics Concern  . Not on file  Social History Narrative  . Not on file   Social Determinants of Health   Financial Resource Strain: Not on file  Food Insecurity: Not on file  Transportation Needs: Not on file  Physical Activity: Not on file  Stress: Not on file  Social Connections: Not on file  Intimate Partner Violence: Not on file    Outpatient Medications Prior to Visit  Medication Sig Dispense Refill  . DEXILANT 60 MG capsule TAKE 1 CAPSULE BY MOUTH EVERY DAY 90 capsule 1  . losartan (COZAAR) 100 MG tablet TAKE 1 TABLET BY MOUTH EVERY DAY. *NEED TO MAKE AN APPT* 90 tablet 1  . metoprolol succinate (TOPROL-XL) 100 MG 24 hr tablet Take 2 tablets (200 mg total) by mouth daily. Take with or immediately following a meal. 90 tablet 1  . famotidine (PEPCID) 20 MG tablet Take 1 tablet (20 mg total) by mouth at bedtime. (Patient not taking: Reported on 10/06/2020) 30 tablet 5  . hydrOXYzine (ATARAX/VISTARIL) 25 MG tablet TAKE 1 TABLET BY MOUTH EVERY 8 HOURS AS NEEDED. (Patient not taking: No sig reported)  270 tablet 0   No facility-administered medications prior to visit.    No Known Allergies  Review of Systems  Respiratory: Negative.   Cardiovascular: Negative.   Musculoskeletal: Positive for arthralgias (bilateral knee and wrist pain) and back pain.       Objective:    Physical Exam Vitals and nursing note reviewed.  Constitutional:      Appearance: Normal appearance.  HENT:     Head: Normocephalic.  Eyes:     Conjunctiva/sclera: Conjunctivae normal.  Cardiovascular:     Rate and Rhythm: Normal rate and regular rhythm.     Pulses: Normal pulses.     Heart sounds: Normal heart sounds.  Pulmonary:     Effort: Pulmonary effort is normal.     Breath sounds: Normal breath sounds.  Musculoskeletal:        General: No tenderness. Normal range of motion.     Cervical back: Normal range of motion.  Skin:    General:  Skin is warm.  Neurological:     General: No focal deficit present.     Mental Status: He is alert and oriented to person, place, and time.  Psychiatric:        Mood and Affect: Mood normal.        Behavior: Behavior normal.        Thought Content: Thought content normal.        Judgment: Judgment normal.     BP (!) 157/90 (BP Location: Left Arm, Cuff Size: Large)   Pulse (!) 103   Temp 99 F (37.2 C) (Oral)   Wt 281 lb 3.2 oz (127.6 kg)   SpO2 98%   BMI 37.27 kg/m  Wt Readings from Last 3 Encounters:  10/06/20 281 lb 3.2 oz (127.6 kg)  09/29/20 281 lb (127.5 kg)  02/27/20 289 lb (131.1 kg)     Lab Results  Component Value Date   TSH 2.170 10/01/2020   Lab Results  Component Value Date   WBC 7.2 10/01/2020   HGB 15.8 10/01/2020   HCT 46.0 10/01/2020   MCV 97 10/01/2020   PLT 192 10/01/2020   Lab Results  Component Value Date   NA 142 10/01/2020   K 4.3 10/01/2020   CO2 18 (L) 10/01/2020   GLUCOSE 110 (H) 10/01/2020   BUN 11 10/01/2020   CREATININE 1.04 10/01/2020   BILITOT 0.5 10/01/2020   ALKPHOS 61 10/01/2020   AST 67 (H) 10/01/2020   ALT 84 (H) 10/01/2020   PROT 7.8 10/01/2020   ALBUMIN 4.8 10/01/2020   CALCIUM 9.7 10/01/2020   ANIONGAP 15 12/04/2019   Lab Results  Component Value Date   CHOL 130 01/11/2017   Lab Results  Component Value Date   HDL 52 01/11/2017   Lab Results  Component Value Date   LDLCALC 56 01/11/2017   Lab Results  Component Value Date   TRIG 109 01/11/2017   Lab Results  Component Value Date   CHOLHDL 2.5 01/11/2017   Lab Results  Component Value Date   HGBA1C 5.2 10/01/2020       Assessment & Plan:   Problem List Items Addressed This Visit      Musculoskeletal and Integument   Osteoarthritis    Chronic pain in bilateral knees and wrists. Has not gone for x-rays yet, will go tomorrow. Verified order is in. Can use voltaren gel OTC to help with pain. Avoid tylenol due to slightly elevated liver function.  Follow-up in 1 week to review  x-rays/possible knee injection       Other Visit Diagnoses    Knee pain, unspecified chronicity, unspecified laterality    -  Primary   Relevant Orders   DG Knee Complete 4 Views Left   DG Knee Complete 4 Views Right     Discussed lab results with him today. Lyme negative, rheumatoid factor slightly elevated. Awaiting x-ray of knees. Liver function is slightly elevated, will continue to watch. Avoid alcohol and tylenol. He stated that he was a heavy drinker, however has significantly cut back and drinks about 2 beers a day. Vitamin B12, folate, and uric acid normal. A1C 5.2 which has improved from last check. Continue diet and lifestyle changes.   No orders of the defined types were placed in this encounter.    Charyl Dancer, NP

## 2020-10-07 ENCOUNTER — Telehealth: Payer: Self-pay

## 2020-10-07 ENCOUNTER — Other Ambulatory Visit: Payer: Self-pay | Admitting: Internal Medicine

## 2020-10-07 DIAGNOSIS — R7989 Other specified abnormal findings of blood chemistry: Secondary | ICD-10-CM

## 2020-10-07 NOTE — Telephone Encounter (Signed)
Patient was called and made aware, patient verbalized understanding.

## 2020-10-08 ENCOUNTER — Other Ambulatory Visit: Payer: Self-pay

## 2020-10-08 ENCOUNTER — Ambulatory Visit: Admit: 2020-10-08 | Payer: Managed Care, Other (non HMO) | Source: Home / Self Care

## 2020-10-08 ENCOUNTER — Ambulatory Visit
Admission: RE | Admit: 2020-10-08 | Discharge: 2020-10-08 | Disposition: A | Discharge status: Home / Self Care | Payer: Managed Care, Other (non HMO) | Source: Ambulatory Visit | Attending: Nurse Practitioner | Admitting: Nurse Practitioner

## 2020-10-08 ENCOUNTER — Ambulatory Visit
Admission: RE | Admit: 2020-10-08 | Discharge: 2020-10-08 | Disposition: A | Discharge status: Home / Self Care | Payer: Managed Care, Other (non HMO) | Source: Home / Self Care | Attending: Nurse Practitioner | Admitting: Nurse Practitioner

## 2020-10-08 DIAGNOSIS — M25569 Pain in unspecified knee: Secondary | ICD-10-CM | POA: Insufficient documentation

## 2020-10-13 ENCOUNTER — Ambulatory Visit: Payer: Managed Care, Other (non HMO) | Admitting: Internal Medicine

## 2020-10-21 ENCOUNTER — Ambulatory Visit: Payer: Managed Care, Other (non HMO) | Admitting: Internal Medicine

## 2020-10-29 ENCOUNTER — Ambulatory Visit: Payer: Managed Care, Other (non HMO)

## 2021-01-02 ENCOUNTER — Other Ambulatory Visit: Payer: Self-pay | Admitting: Internal Medicine

## 2021-01-02 DIAGNOSIS — I10 Essential (primary) hypertension: Secondary | ICD-10-CM

## 2021-01-02 NOTE — Telephone Encounter (Signed)
Medication Refill - Medication: metoprolol succinate (TOPROL-XL) 100 MG 24 hr tablet   Pt called and reported that he is completely out of his current supply.   Has the patient contacted their pharmacy? Yes.   (Agent: If no, request that the patient contact the pharmacy for the refill.) (Agent: If yes, when and what did the pharmacy advise?)  Preferred Pharmacy (with phone number or street name):  [id number] - [place], [address]  [address] [place] [address]  Phone: [phone] Fax: [phone]    Agent: Please be advised that RX refills may take up to 3 business days. We ask that you follow-up with your pharmacy.

## 2021-01-02 NOTE — Telephone Encounter (Signed)
   Notes to clinic: Patient is out of medication  Last filled by rachel lane  Patient has canceled las several appts   Requested Prescriptions  Pending Prescriptions Disp Refills   metoprolol succinate (TOPROL-XL) 100 MG 24 hr tablet 90 tablet 1    Sig: Take 2 tablets (200 mg total) by mouth daily. Take with or immediately following a meal.      Cardiovascular:  Beta Blockers Failed - 01/02/2021 11:16 AM      Failed - Last BP in normal range    BP Readings from Last 1 Encounters:  10/06/20 (!) 157/90          Passed - Last Heart Rate in normal range    Pulse Readings from Last 1 Encounters:  10/06/20 (!) 103          Passed - Valid encounter within last 6 months    Recent Outpatient Visits           2 months ago Knee pain, unspecified chronicity, unspecified laterality   [facility] McElwee, Lauren A, NP   3 months ago Knee pain, unspecified chronicity, unspecified laterality   [facility] Vigg, Avanti, MD   10 months ago Sleep apnea, unspecified type   [facility], Lilia [place], [place]   1 year ago Essential hypertension   [facility] Merrie Roof [place], [place]   1 year ago Essential hypertension   [place], [place], [place]

## 2021-01-13 NOTE — Telephone Encounter (Signed)
For med refills at [facility] [facility] he would need apt. Pt has new pt apt with Dr.K in sep.

## 2021-01-13 NOTE — Telephone Encounter (Signed)
Lvm to ask if he is still a pt here at [facility]

## 2021-01-20 NOTE — Telephone Encounter (Signed)
PT STATED HE WOULD CALL BACK TO MAKE THIS APT.

## 2021-01-21 MED ORDER — METOPROLOL SUCCINATE ER 100 MG PO TB24: 200.0000 mg | ORAL_TABLET | Freq: Every day | ORAL | 1 refills | Status: AC

## 2021-03-23 ENCOUNTER — Other Ambulatory Visit: Payer: Self-pay | Admitting: Internal Medicine

## 2021-03-24 ENCOUNTER — Ambulatory Visit: Payer: Managed Care, Other (non HMO) | Admitting: Family Medicine

## 2021-11-13 ENCOUNTER — Other Ambulatory Visit: Payer: Self-pay | Admitting: Internal Medicine

## 2021-11-13 NOTE — Telephone Encounter (Signed)
Requested medication (s) are due for refill today -no ? ?Requested medication (s) are on the active medication list -no ? ?Future visit scheduled -no ? ?Last refill: medication not on current medication list ? ?Notes to clinic: Requesting RF of medication no longer on medication list- fails visit protocol ? ?Requested Prescriptions  ?Pending Prescriptions Disp Refills  ? amLODipine (NORVASC) 5 MG tablet [Pharmacy Med Name: AMLODIPINE BESYLATE 5 MG TAB] 90 tablet   ?  Sig: TAKE 1 TABLET BY MOUTH EVERY DAY  ?  ? Cardiovascular: Calcium Channel Blockers 2 Failed - 11/13/2021  2:01 AM  ?  ?  Failed - Last BP in normal range  ?  BP Readings from Last 1 Encounters:  ?10/06/20 (!) 157/90  ?   ?  ?  Failed - Valid encounter within last 6 months  ?  Recent Outpatient Visits   ? ?      ? 1 year ago Knee pain, unspecified chronicity, unspecified laterality  ? [place], NP  ? 1 year ago Knee pain, unspecified chronicity, unspecified laterality  ? [facility] Vigg, Avanti, MD  ? 1 year ago Sleep apnea, unspecified type  ? [place], [place]  ? 1 year ago Essential hypertension  ? [place], [place], [place]  ? 2 years ago Essential hypertension  ? [facility] Volney [place], [place]  ? ?  ?  ? ? ?  ?  ?  Passed - Last Heart Rate in normal range  ?  Pulse Readings from Last 1 Encounters:  ?10/06/20 (!) 103  ?   ?  ?  ? ? ? ?Requested Prescriptions  ?Pending Prescriptions Disp Refills  ? amLODipine (NORVASC) 5 MG tablet [Pharmacy Med Name: AMLODIPINE BESYLATE 5 MG TAB] 90 tablet   ?  Sig: TAKE 1 TABLET BY MOUTH EVERY DAY  ?  ? Cardiovascular: Calcium Channel Blockers 2 Failed - 11/13/2021  2:01 AM  ?  ?  Failed - Last BP in normal range  ?  BP Readings from Last 1 Encounters:  ?10/06/20 (!) 157/90  ?   ?  ?  Failed - Valid encounter within last 6 months  ?  Recent Outpatient Visits   ? ?      ? 1 year ago  Knee pain, unspecified chronicity, unspecified laterality  ? [place], NP  ? 1 year ago Knee pain, unspecified chronicity, unspecified laterality  ? [facility] Vigg, Avanti, MD  ? 1 year ago Sleep apnea, unspecified type  ? [place], [place]  ? 1 year ago Essential hypertension  ? [place], [place], [place]  ? 2 years ago Essential hypertension  ? [facility] Volney [place], [place]  ? ?  ?  ? ? ?  ?  ?  Passed - Last Heart Rate in normal range  ?  Pulse Readings from Last 1 Encounters:  ?10/06/20 (!) 103  ?   ?  ?  ? ? ? ?

## 2021-12-27 ENCOUNTER — Emergency Department: Payer: Managed Care, Other (non HMO)

## 2021-12-27 ENCOUNTER — Emergency Department
Admission: EM | Admit: 2021-12-27 | Discharge: 2021-12-27 | Disposition: A | Discharge status: Home / Self Care | Payer: Managed Care, Other (non HMO) | Attending: Student in an Organized Health Care Education/Training Program | Admitting: Student in an Organized Health Care Education/Training Program

## 2021-12-27 ENCOUNTER — Other Ambulatory Visit: Payer: Self-pay

## 2021-12-27 DIAGNOSIS — R350 Frequency of micturition: Secondary | ICD-10-CM | POA: Diagnosis not present

## 2021-12-27 DIAGNOSIS — R109 Unspecified abdominal pain: Secondary | ICD-10-CM | POA: Diagnosis present

## 2021-12-27 DIAGNOSIS — N200 Calculus of kidney: Secondary | ICD-10-CM | POA: Diagnosis not present

## 2021-12-27 DIAGNOSIS — R339 Retention of urine, unspecified: Secondary | ICD-10-CM | POA: Insufficient documentation

## 2021-12-27 LAB — BASIC METABOLIC PANEL
Anion gap: 7 (ref 5–15)
BUN: 9 mg/dL (ref 6–20)
CO2: 27 mmol/L (ref 22–32)
Calcium: 8.8 mg/dL — ABNORMAL LOW (ref 8.9–10.3)
Chloride: 105 mmol/L (ref 98–111)
Creatinine, Ser: 1.17 mg/dL (ref 0.61–1.24)
GFR, Estimated: >60 mL/min (ref >60)
Glucose, Bld: 115 mg/dL — ABNORMAL HIGH (ref 70–99)
Potassium: 3.7 mmol/L (ref 3.5–5.1)
Sodium: 139 mmol/L (ref 135–145)

## 2021-12-27 LAB — URINALYSIS, ROUTINE W REFLEX MICROSCOPIC
Bilirubin Urine: NEGATIVE
Glucose, UA: NEGATIVE mg/dL
Ketones, ur: NEGATIVE mg/dL
Leukocytes,Ua: NEGATIVE
Nitrite: NEGATIVE
Protein, ur: 100 mg/dL — AB
Specific Gravity, Urine: 1.019 (ref 1.005–1.030)
pH: 5 (ref 5.0–8.0)

## 2021-12-27 LAB — CBC
HCT: 46.1 % (ref 39.0–52.0)
Hemoglobin: 15.6 g/dL (ref 13.0–17.0)
MCH: 33.7 pg (ref 26.0–34.0)
MCHC: 33.8 g/dL (ref 30.0–36.0)
MCV: 99.6 fL (ref 80.0–100.0)
Platelets: 210 10*3/uL (ref 150–400)
RBC: 4.63 MIL/uL (ref 4.22–5.81)
RDW: 13.4 % (ref 11.5–15.5)
WBC: 9.4 10*3/uL (ref 4.0–10.5)
nRBC: 0 % (ref 0.0–0.2)

## 2021-12-27 MED ORDER — OXYCODONE-ACETAMINOPHEN 10-325 MG PO TABS: 1.0000 | ORAL_TABLET | Freq: Three times a day (TID) | ORAL | 0 refills | Status: AC | PRN

## 2021-12-27 MED ORDER — OXYCODONE-ACETAMINOPHEN 5-325 MG PO TABS
1.0000 | ORAL_TABLET | Freq: Once | ORAL | Status: AC
  Administered 2021-12-27: 1 via ORAL
  Filled 2021-12-27: qty 1

## 2021-12-27 MED ORDER — TAMSULOSIN HCL 0.4 MG PO CAPS: 0.4000 mg | ORAL_CAPSULE | Freq: Every day | ORAL | 0 refills | Status: AC

## 2021-12-27 NOTE — ED Provider Notes (Signed)
[facility] Provider Note    Event Date/Time   First MD Initiated Contact with Patient 12/27/21 1336     (approximate)   History   Nephrolithiasis   HPI  Alexander Ramsey is a 56 y.o. male with a history of kidney stone in 1990 presents to the ER today with complaint of left flank pain, urinary frequency and urinary retention.  He reports symptoms started this morning.  He describes the pain as sharp and stabbing.  The pain radiates around to the side of his abdomen.  He does report the pain has worsened since this morning.  He denies urgency, dysuria, burning with urination or blood in his urine.  He denies penile discharge, irritation or lesions.  He denies abdominal pain, nausea, vomiting or diarrhea.  He has not taken anything OTC for this.      Physical Exam   Triage Vital Signs: ED Triage Vitals [12/27/21 1258]  Enc Vitals Group     BP (!) 210/100     Pulse Rate 65     Resp (!) 22     Temp (!) 97.4 F (36.3 C)     Temp Source Oral     SpO2 99 %     Weight      Height      Head Circumference      Peak Flow      Pain Score 10     Pain Loc      Pain Edu?      Excl. in GC?     Most recent vital signs: Vitals:   12/27/21 1258  BP: (!) 210/100  Pulse: 65  Resp: (!) 22  Temp: (!) 97.4 F (36.3 C)  SpO2: 99%    General: Awake, appears uncomfortable but in NAD CV:  RRR.  No murmur noted. Resp:  Normal effort.  CTA bilaterally. Abd:  No distention.  Mild pain with palpation over the bladder.  CVA tenderness noted on the left.  ED Results / Procedures / Treatments   Labs (all labs ordered are listed, but only abnormal results are displayed) Labs Reviewed  BASIC METABOLIC PANEL - Abnormal; Notable for the following components:      Result Value   Glucose, Bld 115 (*)    Calcium 8.8 (*)    All other components within normal limits  URINALYSIS, ROUTINE W REFLEX MICROSCOPIC - Abnormal; Notable for the following components:   Color,  Urine AMBER (*)    APPearance CLOUDY (*)    Hgb urine dipstick LARGE (*)    Protein, ur 100 (*)    Bacteria, UA RARE (*)    All other components within normal limits  CBC     RADIOLOGY  Imaging Orders         CT Renal Stone Study     IMPRESSION: Mild to moderate left hydroureteronephrosis and perinephric stranding due to 2 mm calculus at the left UVJ. Small right inguinal hernia, which contains only fat.    MEDICATIONS ORDERED IN ED: Medications  oxyCODONE-acetaminophen (PERCOCET/ROXICET) 5-325 MG per tablet 1 tablet (1 tablet Oral Given 12/27/21 1305)     IMPRESSION / MDM / ASSESSMENT AND PLAN / ED COURSE  I reviewed the triage vital signs and the nursing notes.  Left Flank Pain, Urinary Frequency, Urinary Hesitancy  Differential diagnosis includes, but is not limited to, UTI, acute pyelonephritis, kidney stone  Patient's presentation is most consistent with acute illness / injury with system symptoms.  Urinalysis shows large blood which would be consistent with kidney stone.  No signs of infection noted Bmet shows a normal kidney function and electrolytes CBC does not show any evidence of leukocytosis or anemia CT renal stone study ordered shows a kidney stone at the left UVJ per my read confirmed by radiology Oxycodone -Acetaminophen 5-325 mg p.o. x1  Rx for Flomax 0.4 mg capsules daily x1 week Rx for Oxycodone-Acetaminophen 10-325 mg p.o. every 8 hours as needed for severe pain Push fluids He will follow-up with his PCP for further evaluation and treatment  FINAL CLINICAL IMPRESSION(S) / ED DIAGNOSES   Final diagnoses:  Kidney stone     Rx / DC Orders   ED Discharge Orders          Ordered    tamsulosin (FLOMAX) 0.4 MG CAPS capsule  Daily        12/27/21 1506    oxyCODONE-acetaminophen (PERCOCET) 10-325 MG tablet  Every 8 hours PRN        12/27/21 1506             Note:  This document was prepared using Dragon voice recognition software and may  include unintentional dictation errors.    Lorre Munroe, NP 12/27/21 1506    Willy Eddy, MD 12/27/21 [phone]

## 2022-02-17 ENCOUNTER — Telehealth: Payer: Self-pay

## 2022-02-17 NOTE — Telephone Encounter (Signed)
Copied from [place] [id number]. Topic: General - Other >> Feb 15, 2022  1:37 PM [place] B wrote: Reason for UYE:[id number] called in says needs result of colonscopy results, he says back in June of 2018 for [place] [facility] fx# [phone]  and [phone] phone#. >> Feb 16, 2022 10:10 AM Cyndi Bender wrote: Pt returned call and stated he needs to have a follow up colonoscopy but the GI office will not schedule appt without having the results of his last colonoscopy from June 2018. Pt requests that the results be sent to [facility] fax# [phone]  Cb#  [phone] >> Feb 16, 2022  9:44 AM [place] R wrote: Called and LVM asking for patient to please return my call. Not clear as to what the patient is asking for. Please find out if he calls back? Does he want [place] to contact this location and request his result from 2018? We have one in his chart from 2019.    Last colonoscopy printed and faxed as requested by the patient. Called and LVM letting him know that this was done.

## 2022-10-19 IMAGING — DX DG KNEE COMPLETE 4+V*L*
4 series · 4 of 4 positions shown · non-contrast
Comparison: None.

CLINICAL DATA: Chronic bilateral knee pain

EXAM:
LEFT KNEE - COMPLETE 4+ VIEW

[knee ap]
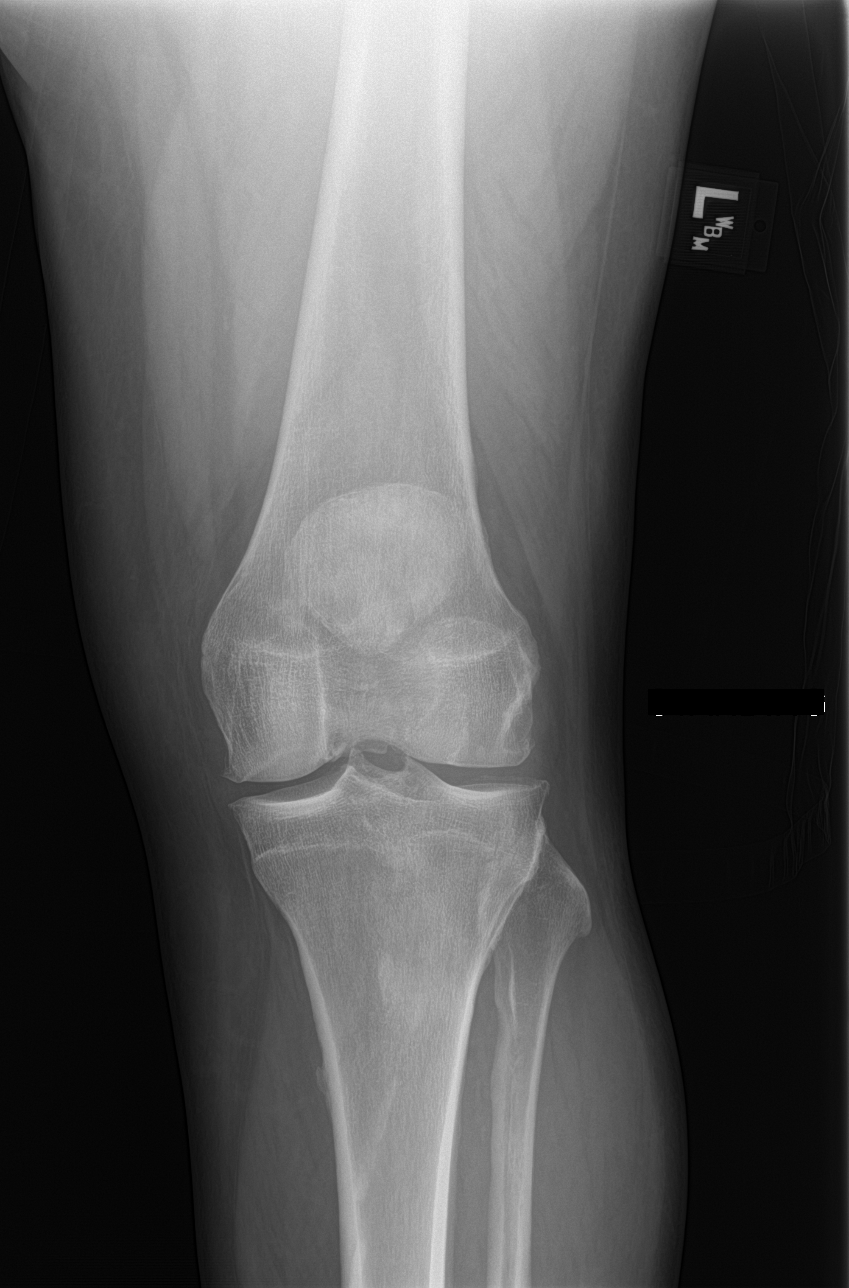

[knee tunnel]
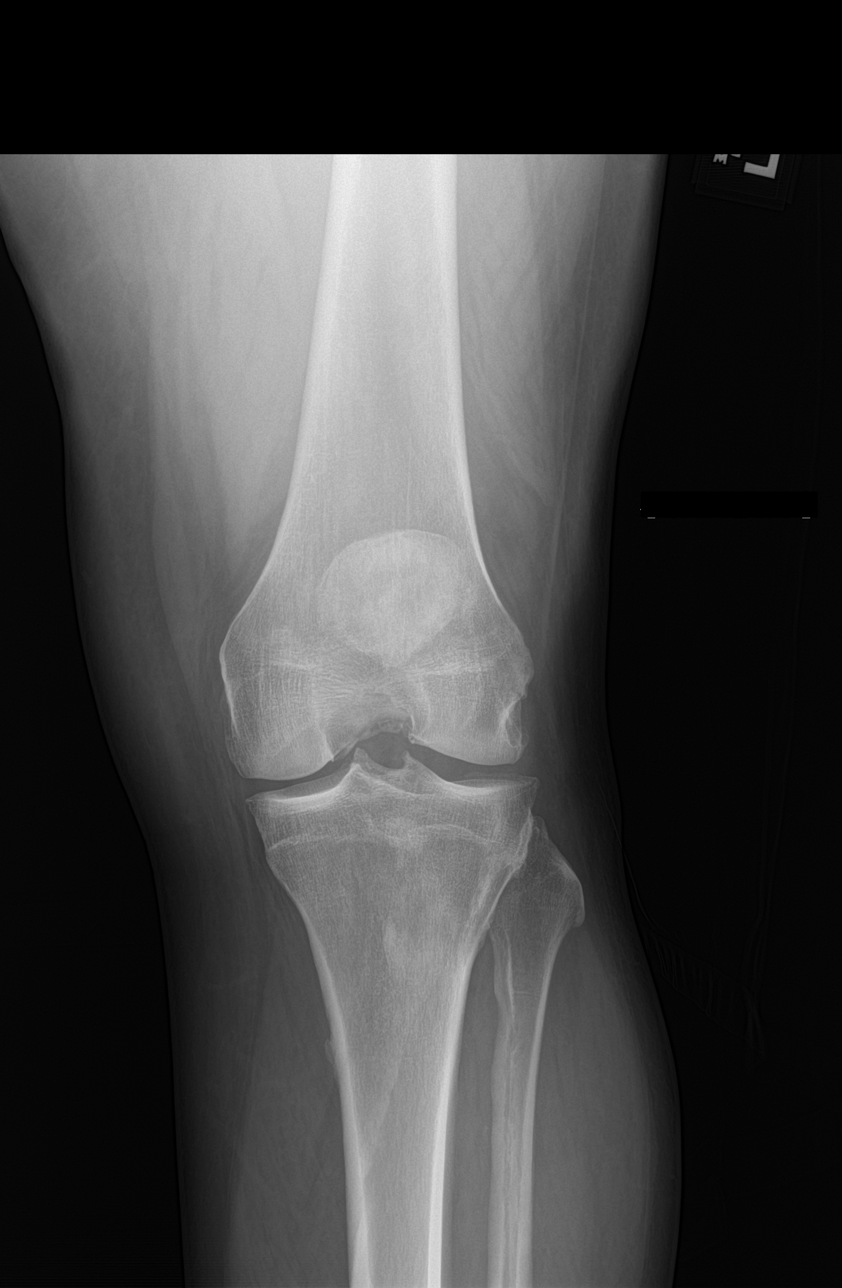

[knee lat]
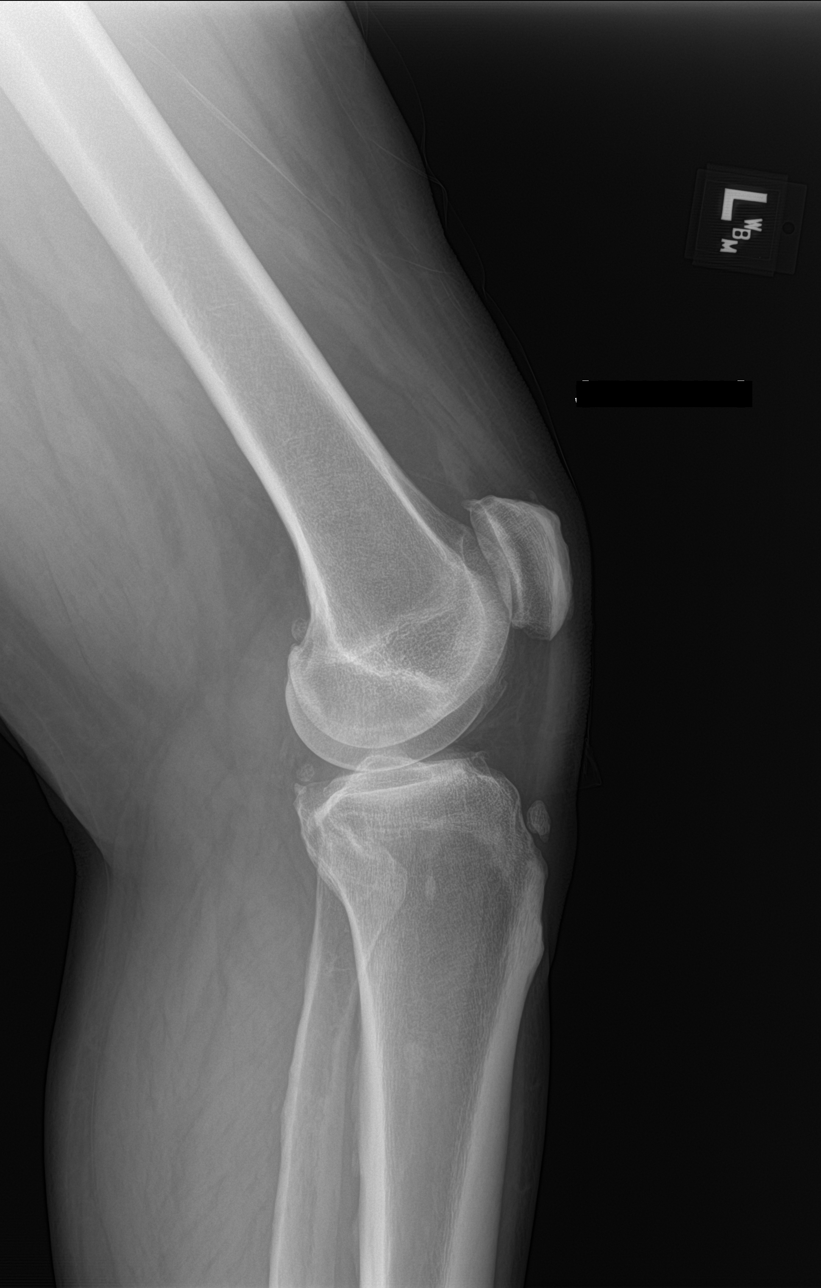

[patella skyline]
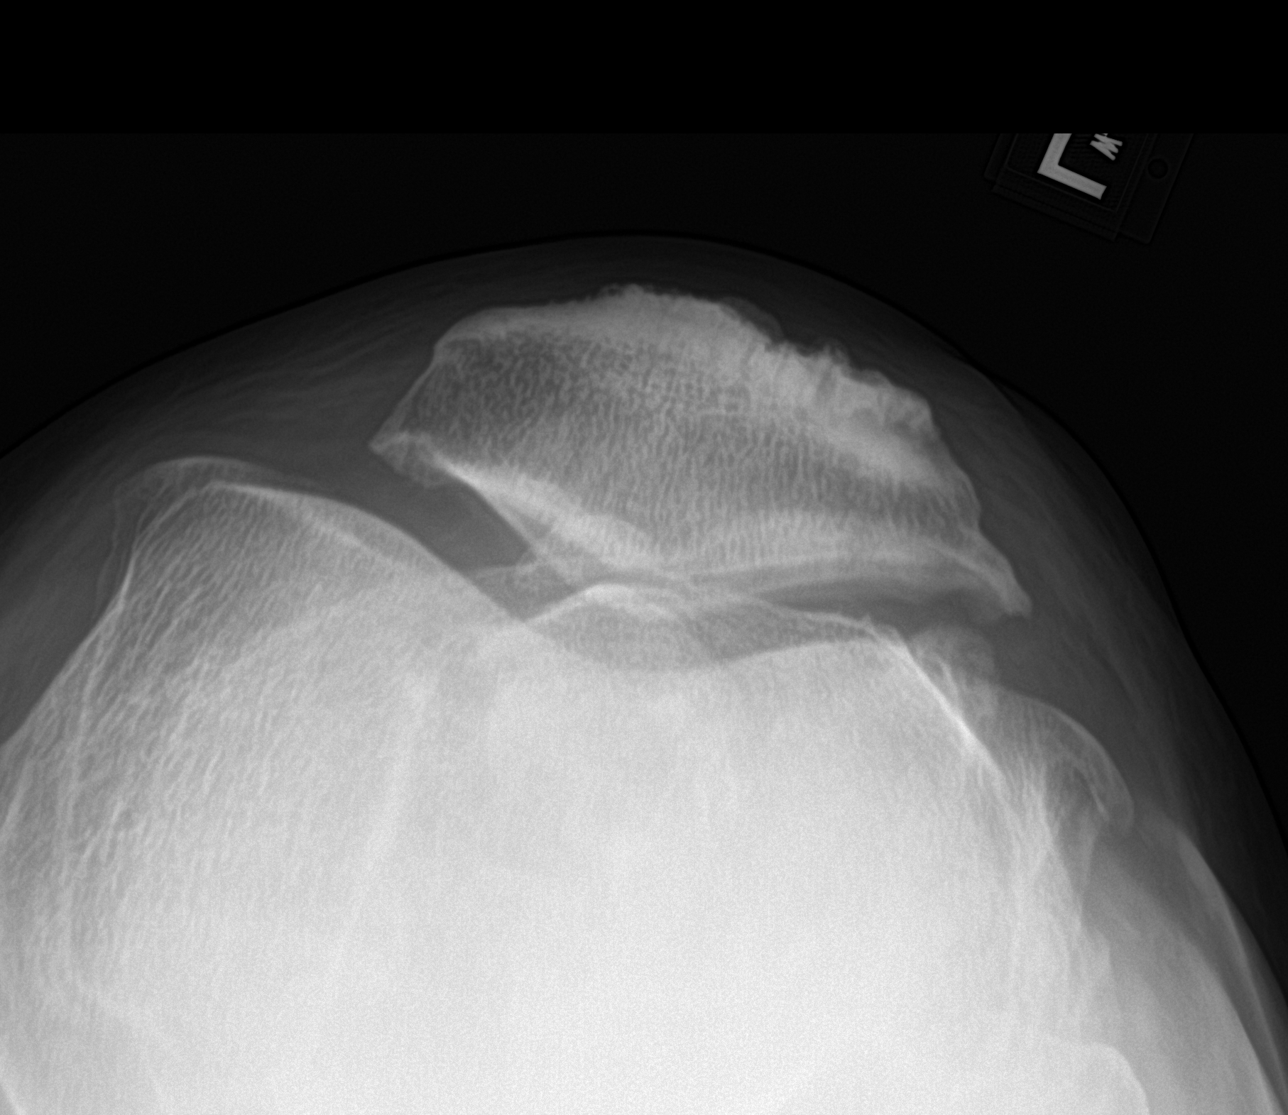

[4 of 4 positions shown; findings below may reference images not displayed]

FINDINGS: Normal alignment. Normal joint spaces. Mild to moderate
tricompartmental peripheral spurring as well as spurring of the
tibial spines. There may be a small ossified body posteriorly versus
fragmented femoral osteophytes. Trace joint effusion. Well
corticated osseous density in the region of the anterior tibial
tubercle may represent remote injury or chronic enthesopathic
change. Mild contour deformity of the proximal fibular diaphysis
suggest remote prior injury. Trace joint effusion. No bony
destruction or erosion.
IMPRESSION: 1. Mild to moderate tricompartmental osteoarthritis. Trace joint
effusion.
2. Possible small ossified body posteriorly versus fragmented
femoral osteophyte.
3. Remote injury of the anterior tibial tubercle versus chronic
enthesopathic change.
